# Patient Record
Sex: Male | Born: 1950 | Race: White | Hispanic: No | Marital: Married | Smoking: Never smoker
Health system: Southern US, Academic
[De-identification: ages and names within clinical notes are randomized; demographics above are authoritative.]

## PROBLEM LIST (undated history)

## (undated) DIAGNOSIS — K219 Gastro-esophageal reflux disease without esophagitis: Secondary | ICD-10-CM

## (undated) DIAGNOSIS — E119 Type 2 diabetes mellitus without complications: Secondary | ICD-10-CM

## (undated) DIAGNOSIS — I10 Essential (primary) hypertension: Secondary | ICD-10-CM

## (undated) DIAGNOSIS — Z77098 Contact with and (suspected) exposure to other hazardous, chiefly nonmedicinal, chemicals: Secondary | ICD-10-CM

## (undated) DIAGNOSIS — E7800 Pure hypercholesterolemia, unspecified: Secondary | ICD-10-CM

## (undated) DIAGNOSIS — Z7739 Contact with and (suspected) exposure to other war theater: Secondary | ICD-10-CM

## (undated) DIAGNOSIS — G4730 Sleep apnea, unspecified: Secondary | ICD-10-CM

## (undated) HISTORY — DX: Gastro-esophageal reflux disease without esophagitis: K21.9

## (undated) HISTORY — DX: Essential (primary) hypertension: I10

## (undated) HISTORY — DX: Sleep apnea, unspecified: G47.30

## (undated) HISTORY — PX: HX GALL BLADDER SURGERY/CHOLE: SHX55

---

## 2017-12-26 ENCOUNTER — Other Ambulatory Visit (HOSPITAL_COMMUNITY): Payer: Self-pay | Admitting: Cardiovascular Disease

## 2021-11-07 ENCOUNTER — Encounter (INDEPENDENT_AMBULATORY_CARE_PROVIDER_SITE_OTHER): Payer: Medicare Other | Admitting: OTOLARYNGOLOGY

## 2021-11-07 ENCOUNTER — Other Ambulatory Visit: Payer: Self-pay

## 2021-12-28 ENCOUNTER — Encounter (INDEPENDENT_AMBULATORY_CARE_PROVIDER_SITE_OTHER): Payer: Self-pay | Admitting: OTOLARYNGOLOGY

## 2021-12-28 ENCOUNTER — Other Ambulatory Visit: Payer: Self-pay

## 2021-12-28 ENCOUNTER — Ambulatory Visit (INDEPENDENT_AMBULATORY_CARE_PROVIDER_SITE_OTHER): Payer: Medicare Other | Admitting: OTOLARYNGOLOGY

## 2021-12-28 VITALS — Ht 70.0 in | Wt 205.0 lb

## 2021-12-28 DIAGNOSIS — J342 Deviated nasal septum: Secondary | ICD-10-CM

## 2021-12-28 DIAGNOSIS — J309 Allergic rhinitis, unspecified: Secondary | ICD-10-CM

## 2021-12-28 DIAGNOSIS — K148 Other diseases of tongue: Secondary | ICD-10-CM

## 2021-12-28 NOTE — H&P (Signed)
[facility]  [facility], [facility]    Progress Note    Name: Evan Bell MRN:  [id number]   Date: 12/28/2021 Age: 71 y.o.          Follow Up      Subjective:   Chief Complaint:   Mouth Lesions (Rc on mouth lesion. States no changes noted.)       History of Present Illness:  Evan Bell is a 71 y.o. old male who presents to the clinic for follow-up. Patient states that he has not had any facial pain, black saliva and tongue lesion is unchanged. He is using Flonase that controls his nasal congestion.    Review of Systems   Constitutional: Negative.         Physical Exam:     Vitals:    12/28/21 1136   Weight: 93 kg (205 lb)   Height: 1.778 m (5\' 10" )   BMI: 29.48      ENT Physical Exam  Constitutional  Appearance: patient appears well-developed, well-nourished and well-groomed,  Communication/Voice: communication appropriate for developmental age; vocal quality normal;  Head and Face  Appearance: head appears normal, face appears normal and face appears atraumatic;  Palpation: facial palpation normal;  Salivary: glands normal;  Ear  Hearing: intact;  Auricles: right auricle normal; left auricle normal;  External Mastoids: right external mastoid normal; left external mastoid normal;  Ear Canals: right ear canal normal; left ear canal normal;  Tympanic Membranes: right tympanic membrane normal; left tympanic membrane normal;  Nose  External Nose: nares patent bilaterally; external nose normal;  Internal Nose: nasal mucosa normal; nasal septal deviation present; bilateral inferior turbinates erythematous;  Oral Cavity/Oropharynx  Lips: normal;  Teeth: normal;  Gums: gingiva normal;  Tongue: tongue lesion (small granuloma left anterior tongue) present;  Oral mucosa: normal;  Hard palate: normal;  Soft palate: normal;  Tonsils: normal;  Base of Tongue: normal;  Posterior pharyngeal wall: normal;  Neck  Neck: neck normal; neck palpation normal;  Thyroid: thyroid normal;  Respiratory  Inspection: breathing unlabored;  normal breathing rate;  Lymphatic  Palpation: lymph nodes normal;  Neurovestibular  Mental Status: alert and oriented;  Psychiatric: mood normal; affect is appropriate;  Cranial Nerves: cranial nerves intact;       Assessment and Plan:       ICD-10-CM    1. DNS (deviated nasal septum)  J34.2       2. Allergic rhinitis, unspecified seasonality, unspecified trigger  J30.9       3. Tongue lesion  K14.8         Continue Flonase  Patient wishes to continue to monitor tongue lesion      Follow up:  Return in about 3 months (around 03/30/2022).    05/30/2022, DO

## 2022-01-03 ENCOUNTER — Telehealth (INDEPENDENT_AMBULATORY_CARE_PROVIDER_SITE_OTHER): Payer: Self-pay | Admitting: INTERVENTIONAL CARDIOLOGY

## 2022-01-09 IMAGING — US US ABDOMEN COMPLETE
1 series · 14 of 25 positions shown · non-contrast
Comparison: None.

﻿EXAM: US ABDOMEN COMPLETE
INDICATION: Right upper quadrant pain.

[Series 1: us abdomen complete · 14 of 47 slices shown]
[im 1/47]
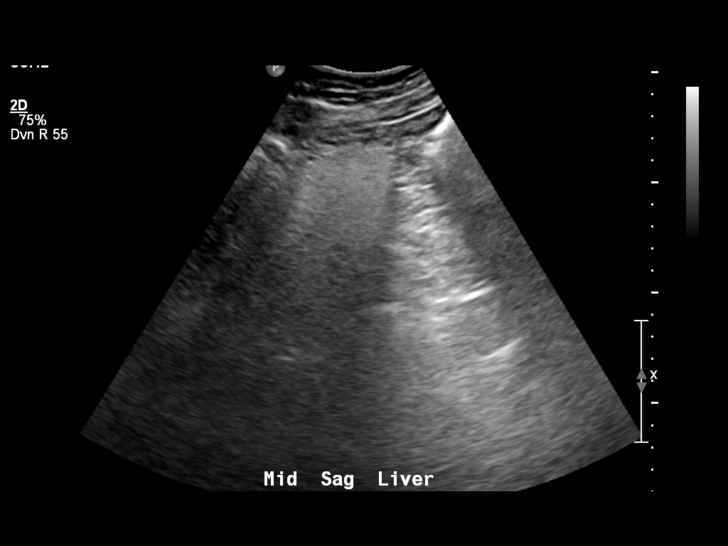
[im 4/47]
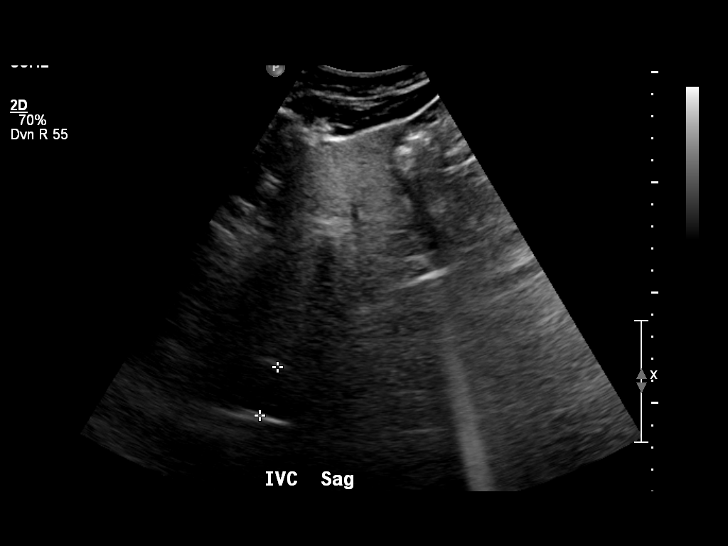
[im 8/47]
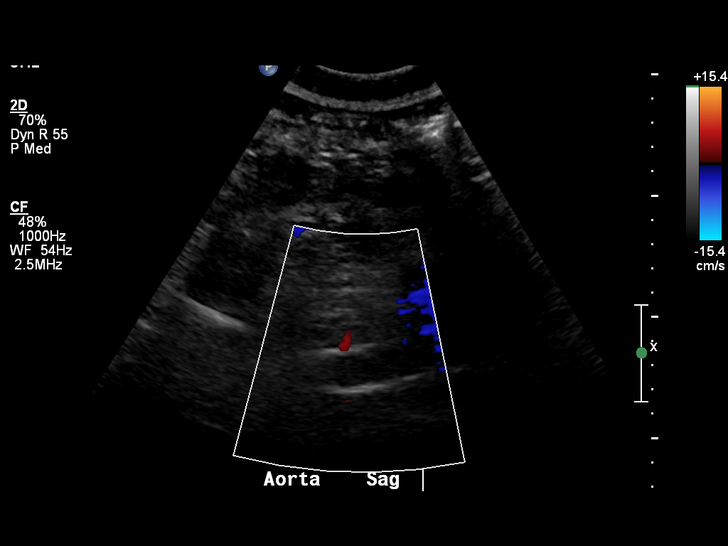
[im 12/47]
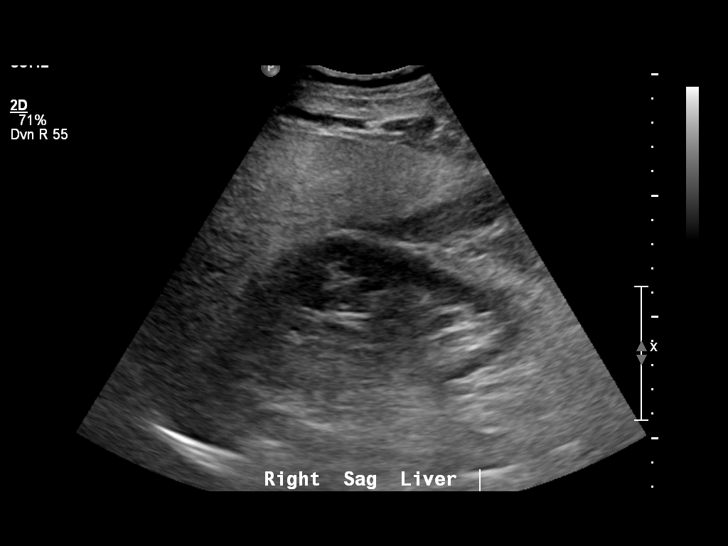
[im 16/47]
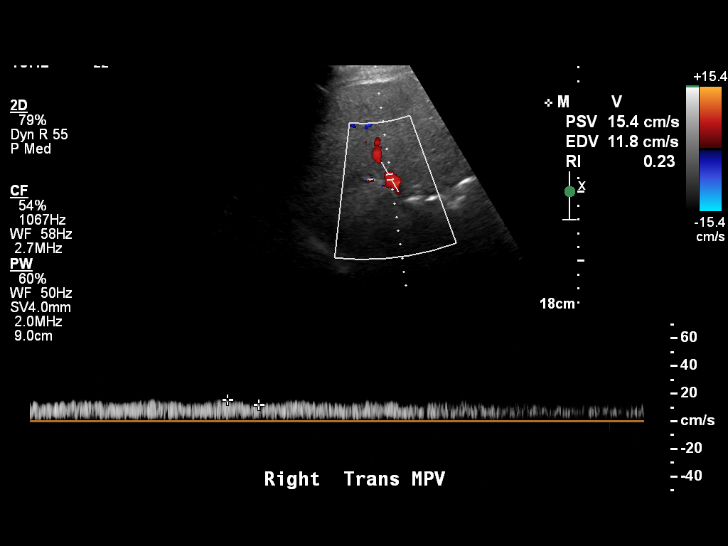
[im 18/47]
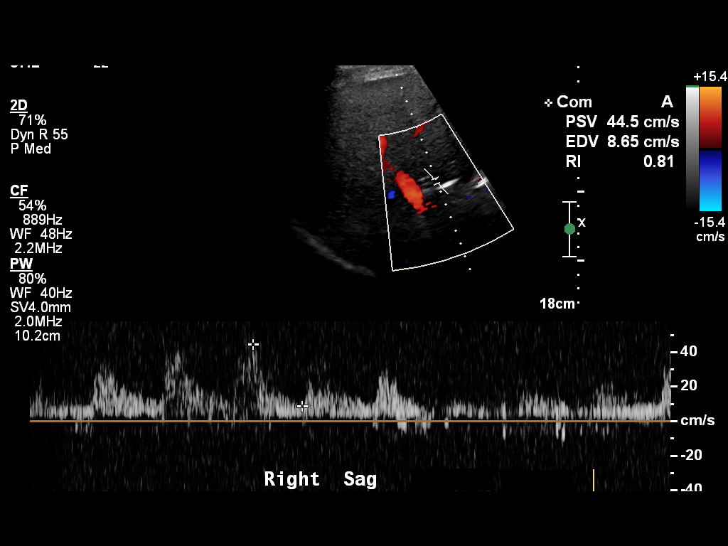
[im 22/47]
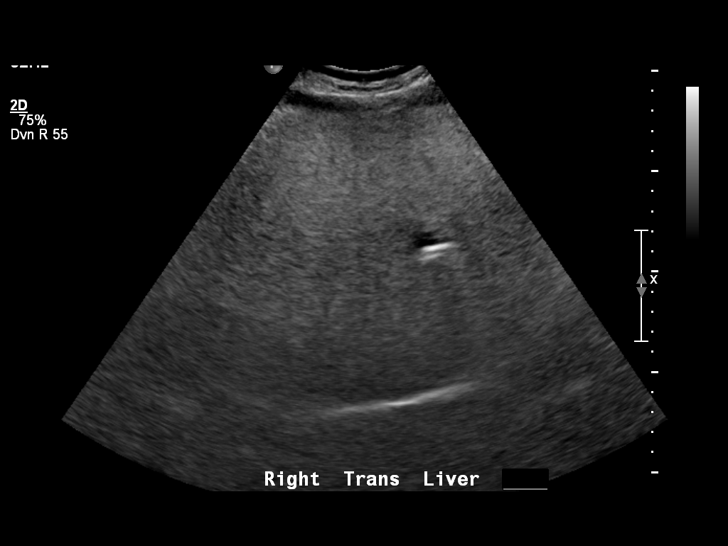
[im 25/47]
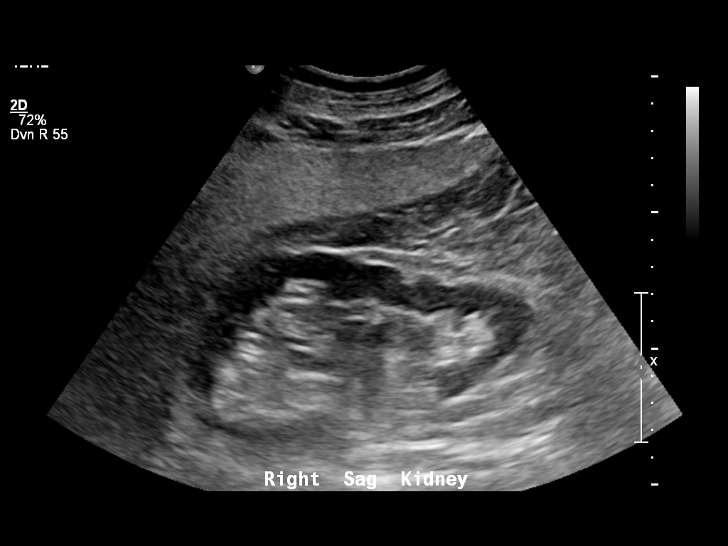
[im 29/47]
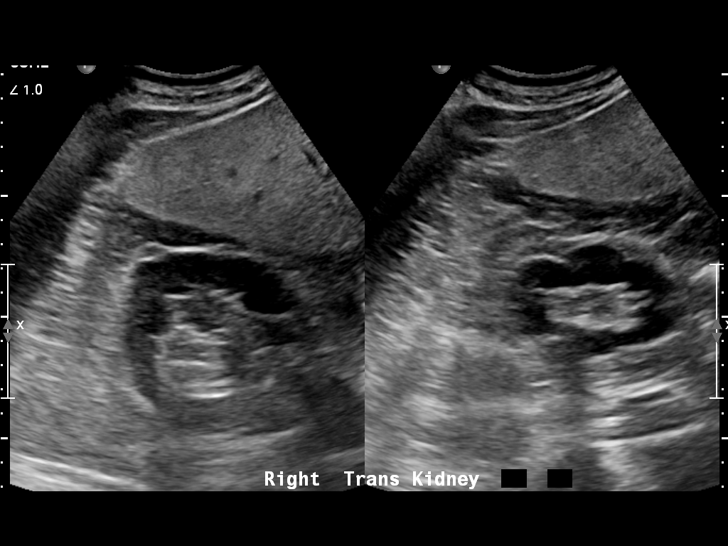
[im 31/47]
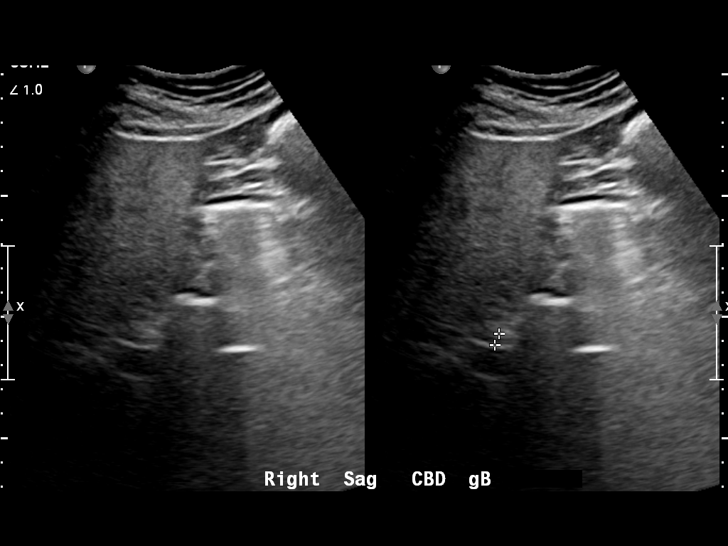
[im 35/47]
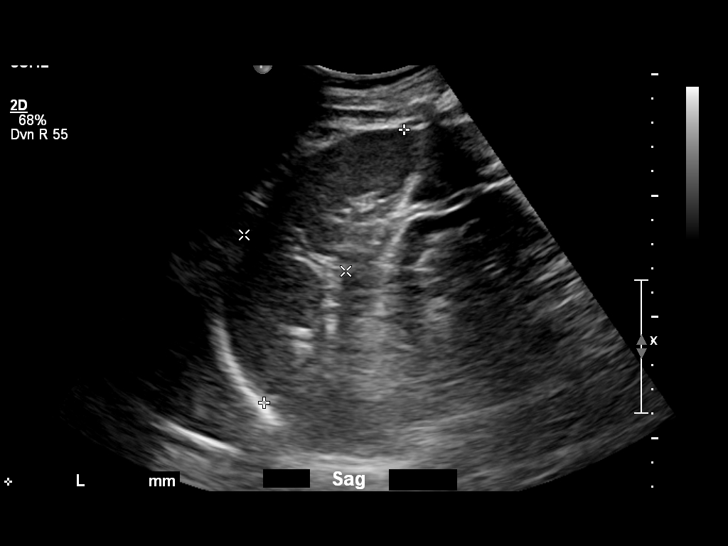
[im 39/47]
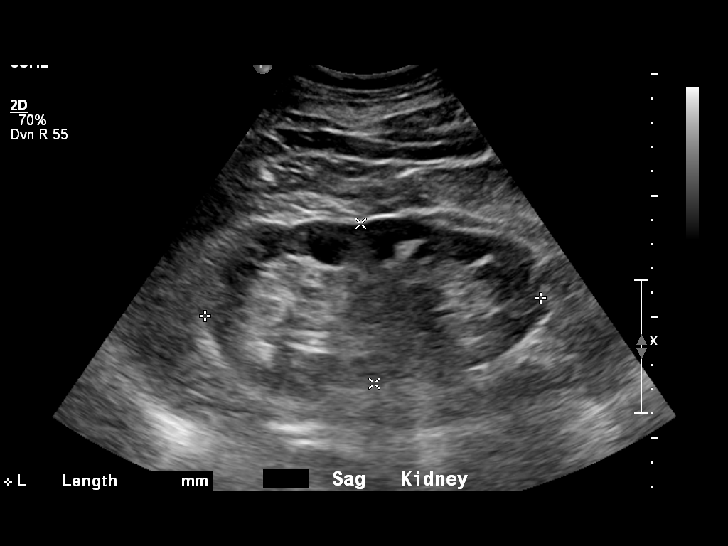
[im 43/47]
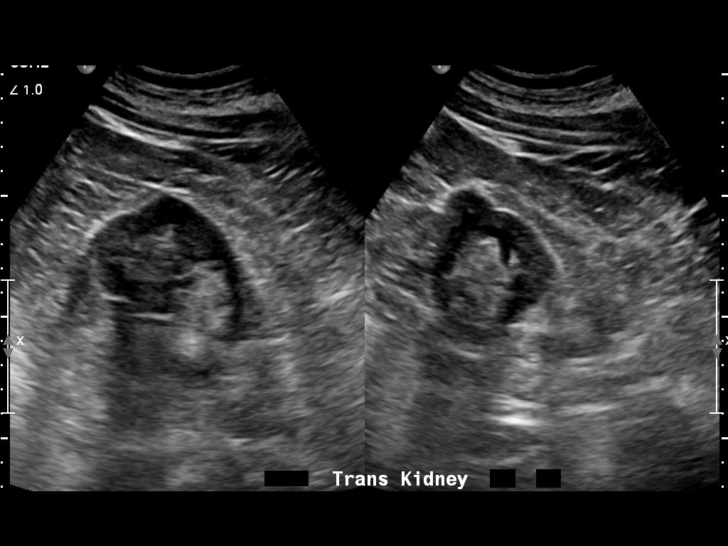
[im 47/47]
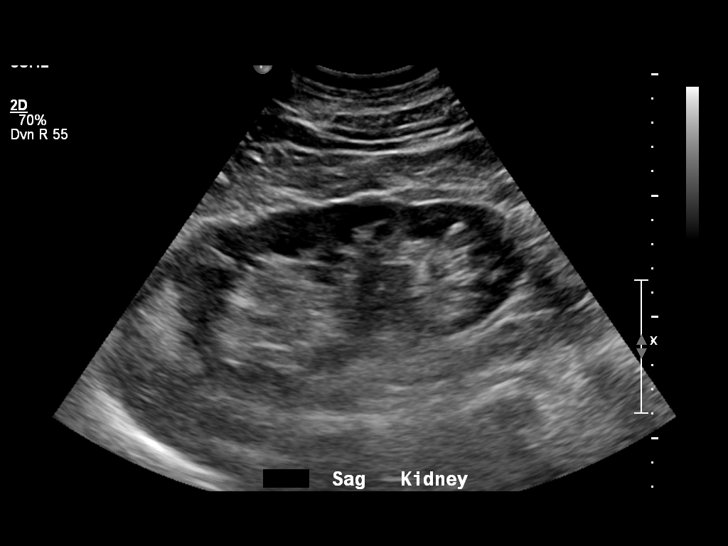

[14 of 25 positions shown; findings below may reference images not displayed]

FINDINGS: Liver is echogenic compatible with fatty infiltration.  Fatty infiltration limits evaluation for focal hepatic mass. There is no intra or extrahepatic biliary ductal dilation. Common bile duct measures 5 mm.  Gallbladder is surgically absent. Pancreas is incompletely visualized due to artifact from overlying bowel gas. Spleen measures 12.5 cm and is unremarkable.

Kidneys are normal in echogenicity and measure 13.5 cm bilaterally. There is no hydronephrosis, mass or cyst on either side.

Abdominal aorta is also not well visualized. IVC is normal. There is no ascites.
IMPRESSION: 1. Fatty liver.

2. Prior cholecystectomy.

3. Pancreas and abdominal aorta incompletely visualized due to artifact from overlying bowel gas.

## 2022-01-29 ENCOUNTER — Other Ambulatory Visit: Payer: Self-pay

## 2022-01-29 ENCOUNTER — Encounter (HOSPITAL_COMMUNITY)
Admission: RE | Disposition: A | Discharge status: Home / Self Care | Payer: Self-pay | Source: Ambulatory Visit | Attending: INTERVENTIONAL CARDIOLOGY

## 2022-01-29 ENCOUNTER — Encounter (HOSPITAL_COMMUNITY): Payer: Self-pay | Admitting: INTERVENTIONAL CARDIOLOGY

## 2022-01-29 ENCOUNTER — Inpatient Hospital Stay
Admission: RE | Admit: 2022-01-29 | Discharge: 2022-01-29 | Disposition: A | Discharge status: Home / Self Care | Payer: Medicare Other | Source: Ambulatory Visit | Attending: INTERVENTIONAL CARDIOLOGY | Admitting: INTERVENTIONAL CARDIOLOGY

## 2022-01-29 DIAGNOSIS — I10 Essential (primary) hypertension: Secondary | ICD-10-CM | POA: Diagnosis present

## 2022-01-29 DIAGNOSIS — I208 Other forms of angina pectoris: Secondary | ICD-10-CM | POA: Diagnosis present

## 2022-01-29 DIAGNOSIS — E782 Mixed hyperlipidemia: Secondary | ICD-10-CM | POA: Diagnosis present

## 2022-01-29 DIAGNOSIS — I2510 Atherosclerotic heart disease of native coronary artery without angina pectoris: Secondary | ICD-10-CM | POA: Insufficient documentation

## 2022-01-29 DIAGNOSIS — R079 Chest pain, unspecified: Secondary | ICD-10-CM

## 2022-01-29 LAB — CBC WITH DIFF
BASOPHIL #: 0.1 10*3/uL (ref 0.00–0.30)
BASOPHIL %: 1 % (ref 0–3)
EOSINOPHIL #: 0.4 10*3/uL (ref 0.00–0.80)
EOSINOPHIL %: 5 % (ref 0–7)
HCT: 41.3 % — ABNORMAL LOW (ref 42.0–51.0)
HGB: 14.2 g/dL (ref 13.5–18.0)
LYMPHOCYTE #: 2.6 10*3/uL (ref 1.10–5.00)
LYMPHOCYTE %: 32 % (ref 25–45)
MCH: 29.2 pg (ref 27.0–32.0)
MCHC: 34.4 g/dL (ref 32.0–36.0)
MCV: 84.7 fL (ref 78.0–99.0)
MONOCYTE #: 0.9 10*3/uL (ref 0.00–1.30)
MONOCYTE %: 11 % (ref 0–12)
MPV: 7.3 fL — ABNORMAL LOW (ref 7.4–10.4)
NEUTROPHIL #: 4.2 10*3/uL (ref 1.80–8.40)
NEUTROPHIL %: 51 % (ref 40–76)
PLATELETS: 256 10*3/uL (ref 140–440)
RBC: 4.88 10*6/uL (ref 4.20–6.00)
RDW: 13.5 % (ref 11.6–14.8)
WBC: 8.2 10*3/uL (ref 4.0–10.5)
WBCS UNCORRECTED: 8.2 10*3/uL

## 2022-01-29 LAB — BASIC METABOLIC PANEL
ANION GAP: 11 mmol/L (ref 10–20)
BUN/CREA RATIO: 13 (ref 6–22)
BUN: 19 mg/dL (ref 7–25)
CALCIUM: 10.2 mg/dL (ref 8.6–10.3)
CHLORIDE: 90 mmol/L — ABNORMAL LOW (ref 98–107)
CO2 TOTAL: 28 mmol/L (ref 21–31)
CREATININE: 1.46 mg/dL — ABNORMAL HIGH (ref 0.60–1.30)
ESTIMATED GFR: 51 mL/min/{1.73_m2} — ABNORMAL LOW (ref >59)
GLUCOSE: 167 mg/dL — ABNORMAL HIGH (ref 74–109)
OSMOLALITY, CALCULATED: 265 mOsm/kg — ABNORMAL LOW (ref 270–290)
POTASSIUM: 4.2 mmol/L (ref 3.5–5.1)
SODIUM: 129 mmol/L — ABNORMAL LOW (ref 136–145)

## 2022-01-29 LAB — PTT (PARTIAL THROMBOPLASTIN TIME): APTT: 31.4 seconds (ref 26.0–36.0)

## 2022-01-29 LAB — PT/INR
INR: 1.06 (ref <=5.00)
PROTHROMBIN TIME: 12.3 seconds (ref 9.8–12.7)

## 2022-01-29 LAB — MAGNESIUM: MAGNESIUM: 1.6 mg/dL — ABNORMAL LOW (ref 1.9–2.7)

## 2022-01-29 SURGERY — CORONARY ANGIOGRAPHY
Anesthesia: IV Sedation (Nurse Monitored)

## 2022-01-29 MED ORDER — DIAZEPAM 5 MG TABLET
5.0000 mg | ORAL_TABLET | ORAL | Status: AC
Start: 2022-01-29 — End: 2022-01-29
  Administered 2022-01-29: 5 mg via ORAL

## 2022-01-29 MED ORDER — HEPARIN (PORCINE) (PF) 1,000 UNIT/500 ML IN 0.9 % SODIUM CHLORIDE IV
INTRAVENOUS | Status: AC
Start: 2022-01-29 — End: 2022-01-29
  Filled 2022-01-29: qty 1500

## 2022-01-29 MED ORDER — BIVALIRUDIN 250 MG INTRAVENOUS POWDER FOR SOLUTION
INTRAVENOUS | Status: AC
Start: 2022-01-29 — End: 2022-01-29
  Filled 2022-01-29: qty 5

## 2022-01-29 MED ORDER — SODIUM CHLORIDE 0.9 % INTRAVENOUS SOLUTION
INTRAVENOUS | Status: DC
Start: 2022-01-29 — End: 2022-01-29
  Administered 2022-01-29: 0 mL via INTRAVENOUS

## 2022-01-29 MED ORDER — FENTANYL (PF) 50 MCG/ML INJECTION WRAPPER
INJECTION | Freq: Once | INTRAMUSCULAR | Status: DC | PRN
Start: 2022-01-29 — End: 2022-01-29
  Administered 2022-01-29: 50 ug via INTRAVENOUS

## 2022-01-29 MED ORDER — VERAPAMIL 2.5 MG/ML INTRAVENOUS SOLUTION
INTRAVENOUS | Status: AC
Start: 2022-01-29 — End: 2022-01-29
  Filled 2022-01-29: qty 2

## 2022-01-29 MED ORDER — VERAPAMIL 2.5 MG/ML INTRAVENOUS SOLUTION
Freq: Once | INTRAVENOUS | Status: DC | PRN
Start: 2022-01-29 — End: 2022-01-29
  Administered 2022-01-29: 5 mg via INTRA_ARTERIAL

## 2022-01-29 MED ORDER — MAGNESIUM SULFATE 1 GRAM/100 ML IN DEXTROSE 5 % INTRAVENOUS PIGGYBACK
1.0000 g | INJECTION | Freq: Once | INTRAVENOUS | Status: AC
Start: 2022-01-29 — End: 2022-01-29
  Administered 2022-01-29: 1 g via INTRAVENOUS
  Administered 2022-01-29: 0 g via INTRAVENOUS

## 2022-01-29 MED ORDER — NITROGLYCERIN 100 MCG/ML IN NS INJECTION
INJECTION | Freq: Once | INTRAVENOUS | Status: DC | PRN
Start: 2022-01-29 — End: 2022-01-29
  Administered 2022-01-29: 200 ug via INTRA_ARTERIAL

## 2022-01-29 MED ORDER — MIDAZOLAM 5 MG/ML INJECTION WRAPPER
Freq: Once | INTRAMUSCULAR | Status: DC | PRN
Start: 2022-01-29 — End: 2022-01-29
  Administered 2022-01-29: 2 mg via INTRAVENOUS

## 2022-01-29 MED ORDER — RANOLAZINE ER 500 MG TABLET,EXTENDED RELEASE,12 HR
500.0000 mg | ORAL_TABLET | Freq: Two times a day (BID) | ORAL | 4 refills | Status: DC
Start: 2022-01-29 — End: 2023-11-14

## 2022-01-29 MED ORDER — HEPARIN (PORCINE) 1,000 UNIT/ML INJECTION SOLUTION
Freq: Once | INTRAMUSCULAR | Status: DC | PRN
Start: 2022-01-29 — End: 2022-01-29
  Administered 2022-01-29: 4750 [IU] via INTRAVENOUS

## 2022-01-29 MED ORDER — SODIUM CHLORIDE 0.9 % INTRAVENOUS SOLUTION
INTRAVENOUS | Status: AC | PRN
Start: 2022-01-29 — End: 2022-01-29
  Administered 2022-01-29: 150 mL/h via INTRAVENOUS

## 2022-01-29 MED ORDER — MIDAZOLAM 5 MG/ML INJECTION WRAPPER
INTRAMUSCULAR | Status: AC
Start: 2022-01-29 — End: 2022-01-29
  Filled 2022-01-29: qty 1

## 2022-01-29 MED ORDER — METFORMIN 1,000 MG TABLET
1000.0000 mg | ORAL_TABLET | Freq: Two times a day (BID) | ORAL | Status: DC
Start: 2022-01-29 — End: 2023-11-14

## 2022-01-29 MED ORDER — ASPIRIN 81 MG CHEWABLE TABLET
CHEWABLE_TABLET | ORAL | Status: AC
Start: 2022-01-29 — End: 2022-01-29
  Filled 2022-01-29: qty 4

## 2022-01-29 MED ORDER — ACETAMINOPHEN 325 MG TABLET
975.0000 mg | ORAL_TABLET | Freq: Once | ORAL | Status: DC | PRN
Start: 2022-01-29 — End: 2022-01-29

## 2022-01-29 MED ORDER — ASPIRIN 81 MG CHEWABLE TABLET
324.0000 mg | CHEWABLE_TABLET | Freq: Once | ORAL | Status: AC
Start: 2022-01-29 — End: 2022-01-29
  Administered 2022-01-29: 324 mg via ORAL

## 2022-01-29 MED ORDER — DIPHENHYDRAMINE 25 MG CAPSULE
25.0000 mg | ORAL_CAPSULE | ORAL | Status: AC
Start: 2022-01-29 — End: 2022-01-29
  Administered 2022-01-29: 25 mg via ORAL

## 2022-01-29 MED ORDER — MAGNESIUM SULFATE 1 GRAM/100 ML IN DEXTROSE 5 % INTRAVENOUS PIGGYBACK
INJECTION | INTRAVENOUS | Status: AC
Start: 2022-01-29 — End: 2022-01-29
  Filled 2022-01-29: qty 200

## 2022-01-29 MED ORDER — FENTANYL (PF) 50 MCG/ML INJECTION SOLUTION
INTRAMUSCULAR | Status: AC
Start: 2022-01-29 — End: 2022-01-29
  Filled 2022-01-29: qty 2

## 2022-01-29 MED ORDER — LIDOCAINE HCL 10 MG/ML (1 %) INJECTION SOLUTION
Freq: Once | INTRAMUSCULAR | Status: DC | PRN
Start: 2022-01-29 — End: 2022-01-29
  Administered 2022-01-29: 5 mL via INTRADERMAL

## 2022-01-29 MED ORDER — DIAZEPAM 5 MG TABLET
ORAL_TABLET | ORAL | Status: AC
Start: 2022-01-29 — End: 2022-01-29
  Filled 2022-01-29: qty 1

## 2022-01-29 MED ORDER — LIDOCAINE HCL 10 MG/ML (1 %) INJECTION SOLUTION
INTRAMUSCULAR | Status: AC
Start: 2022-01-29 — End: 2022-01-29
  Filled 2022-01-29: qty 50

## 2022-01-29 MED ORDER — MAGNESIUM SULFATE 1 GRAM/100 ML IN DEXTROSE 5 % INTRAVENOUS PIGGYBACK
1.0000 g | INJECTION | Freq: Once | INTRAVENOUS | Status: AC
Start: 2022-01-29 — End: 2022-01-29
  Administered 2022-01-29: 0 g via INTRAVENOUS
  Administered 2022-01-29: 1 g via INTRAVENOUS

## 2022-01-29 MED ORDER — IOHEXOL 350 MG IODINE/ML INTRAVENOUS SOLUTION
Freq: Once | INTRAVENOUS | Status: DC | PRN
Start: 2022-01-29 — End: 2022-01-29
  Administered 2022-01-29: 60 mL via INTRA_ARTERIAL

## 2022-01-29 MED ORDER — SODIUM CHLORIDE 0.9 % (FLUSH) INJECTION SYRINGE
3.0000 mL | INJECTION | Freq: Three times a day (TID) | INTRAMUSCULAR | Status: DC
Start: 2022-01-29 — End: 2022-01-29

## 2022-01-29 MED ORDER — SODIUM CHLORIDE 0.9 % (FLUSH) INJECTION SYRINGE
3.0000 mL | INJECTION | INTRAMUSCULAR | Status: DC | PRN
Start: 2022-01-29 — End: 2022-01-29

## 2022-01-29 MED ORDER — DIPHENHYDRAMINE 25 MG CAPSULE
ORAL_CAPSULE | ORAL | Status: AC
Start: 2022-01-29 — End: 2022-01-29
  Filled 2022-01-29: qty 1

## 2022-01-29 SURGICAL SUPPLY — 16 items
CATH ANGIO 5FR JR5 CURVE 100CM PERFORMA RADOPQ BRD PERI (CATHETERS) ×1
CATH ANGIO 5FR JR5 CURVE 100CM PERFORMA RADOPQ BRD PERI PEBAX PLYCRB NYL STRL ACPT .038IN GW (CATHETERS) ×1 IMPLANT
CATH ANGIO 5FR PGTL ST CURVE 110CM PERFORMA MIV 6 SH RADOPQ (CATHETERS) ×1
CATH ANGIO 5FR PGTL ST CURVE 110CM PERFORMA MIV 6 SH RADOPQ BRD RADIAL PEBAX PLYCRB NYL STRL LF (CATHETERS) ×1 IMPLANT
CATH ANGIO 5FR RADIAL TIG 4 CURVE 100CM OPTITORQUE LRG LUM SH 2 BRD SFT TIP COR SS NYL POLYUR (CATHETERS) ×1 IMPLANT
CATH ANGIO 5FR RADL TIG 4 CURV_E 100CM OPTITORQUE LRG LUM SH (CATHETERS) ×1
DEVICE COMPRESS TR BAND 24CM 2 BAL TRNSPR ADJ STRAP REG RADIAL ART (MED SURG SUPPLIES) ×1 IMPLANT
DEVICE COMPRESS TR BAND 24CM 2_BAL TRNSPR ADJ STRAP AIR TRTN (MED SURG SUPPLIES) ×1
GW .035IN 260CM ANGIO PTFE FIX COR STD BODY STN FNSH 2 END 3MM J CURVE STRL (WIRE) ×1 IMPLANT
GW GLDWR .035IN 3CM 150CM FLXB ANG TIP RADOPQ KINK RST NITINOL TUNG POLYUR HDRPH VAS STD (WIRE) ×1 IMPLANT
GW GLDWR .035IN 3CM 150CM RADO_Q STD SHFT FLXB COR TO TIP (WIRE) ×1
GW INQWR .035IN 260CM FIX COR_FINGER STRG STD SHAFT PTFE VAS (WIRE) ×1
GW STORQ .035IN 300CM STD TIP STRBL VAS STR LF (WIRE) ×2 IMPLANT
KIT INTROD 10CM 6FR 22GA GLIDESHEATH SLNDR .021IN PLASTIC SHEATH DIL 2 WL PNCT SHORT ANG MINIWIRE 45 (INTRODUCER) ×1 IMPLANT
KIT INTROD 10CM 6FR 22GA GLIDE_SHEATH SLNDR .021IN PLASTIC (INTRODUCER) ×1
SET ADMN MERIT MEDICAL SYS INJ MED CNRST S DISP (VASCULAR) ×2 IMPLANT

## 2022-01-29 NOTE — Nurses Notes (Signed)
Patient voided 600 cc's clear, yellow urine without any complaints or problems.

## 2022-01-29 NOTE — Nurses Notes (Signed)
Patient drinking without any problems. Lunch ordered. Called and updated patient's family. Spoke with his cousin Angie Maggard who is staying with his wife. Informed should be discharged around 1300 if no problems. She voiced understanding. Informed her we go over discharge instructions with her when she arrives.

## 2022-01-29 NOTE — Nurses Notes (Signed)
TR Band was removed. Site soft with no bleeding, swelling or signs of hematoma. Able to move fingers freely. Right radial pulse normal. Sterile 2x2 applied and secured with opsite. Reminded patient no lifting more than 2 lbs. X 2 days. Patient voiced understanding.

## 2022-01-29 NOTE — H&P (Signed)
[facility]  H&P Update Form    Evan Bell, Evan Bell, 71 y.o. male  Encounter Start Date:  01/29/2022  Inpatient Admission Date:   Date of Birth:  1950-12-27    01/29/2022    STOP: IF H&P IS GREATER THAN 30 DAYS FROM SURGICAL DAY COMPLETE NEW H&P IS REQUIRED.     H & P updated the day of the procedure.  1.  H&P completed within 30 days of surgical procedure and has been reviewed within 24 hours of admission but prior to surgery or a procedure requiring anesthesia services by Dr. Lana Fish within 30 days of procedure , the patient has been examined, and no change has occured in the patients condition since the H&P was completed.       Change in medications: No                   2.  Patient continues to be appropiate candidate for planned surgical procedure. YES      [name], MD

## 2022-01-29 NOTE — Nurses Notes (Signed)
Patient sitting up in bed. Ate 100% for lunch. Dr. Elesa Massed in discussing results of LHC with patient.

## 2022-01-29 NOTE — Nurses Notes (Signed)
Patient ambulated to restroom. Gait steady and coordinated. Denies any complaints of lightheadedness or dizziness.

## 2022-01-29 NOTE — Nurses Notes (Signed)
Dr. Ward in to see patient

## 2022-01-29 NOTE — Nurses Notes (Signed)
Patient discharged to home with his cousin Evan Bell who is driving him home. Discharge instructions reviewed with patient and cousin. Instructed new prescription called into [facility] in [place], [place] for [place]a. Reviewed the Stop medications on his discharge papers. Pt. States he has discontinued those and will check on the ones that are listed on how to take. Instructed to hold Metformin x 48 hours then resume. Reminded not to lift anything that weighs more than 2 lbs. X 2 days. If starts to bleed, swell or develop a hematoma apply manual pressure and call 911. Patient and cousin deny any further questions at discharge. Patient taken out via wheelchair to private vehicle. Medications and cellphone in hand. Denies any questions.

## 2022-01-29 NOTE — Discharge Instructions (Addendum)
Follow up appointment with Dr. Lana Fish on February 12, 2022 @ 1:30 pm    Patient did not bring several medications that was on his list. States he only takes what he brought with him. Encouraged the patient to check his home medication list.  STOP medications is highlighted in red. These are the ones that he did not bring with him.    Hold Metformin x 2 days then resume.

## 2022-04-03 ENCOUNTER — Encounter (INDEPENDENT_AMBULATORY_CARE_PROVIDER_SITE_OTHER): Payer: Self-pay | Admitting: OTOLARYNGOLOGY

## 2022-04-06 IMAGING — MR MRI BRAIN W/O CONTRAST
8 of 9 series · 36 of 48 positions shown · non-contrast
Comparison: No prior imaging studies of the brain are available for comparison.

﻿EXAM:  MRI BRAIN W/O CONTRAST
INDICATION: 71-year-old having bilateral lower extremity weakness and loss of balance.  No history of malignancy or cranial surgery.
TECHNIQUE: Axial, coronal and sagittal images including diffusion sequence, T2* gradient echo sequence, FLAIR sequence and T1 and T2 sequences.

[Series 5: DWI · axial · 5.0mm · 1.35mm/px · z∈[-67,+53]mm · 10 of 88 slices shown (1 of 3)]
[im 6/88]
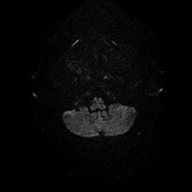
[im 12/88]
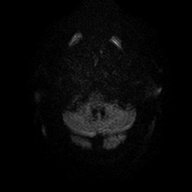
[im 18/88]
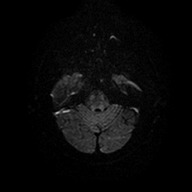
[im 30/88]
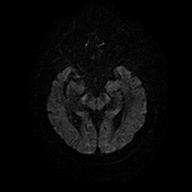
[im 41/88]
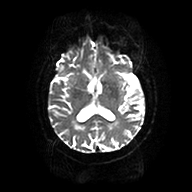
[im 47/88]
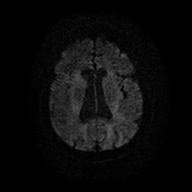
[im 53/88]
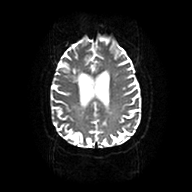
[im 64/88]
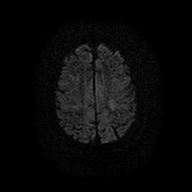
[im 76/88]
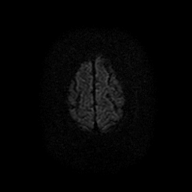
[im 88/88]
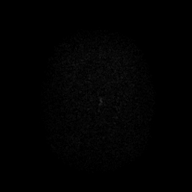

[Series 6: DWI · axial · 5.0mm · 1.35mm/px · z∈[-73,+53]mm · 4 of 22 slices shown (2 of 3)]
[im 1/22]
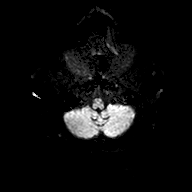
[im 8/22]
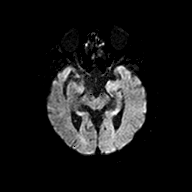
[im 15/22]
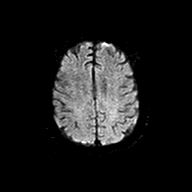
[im 22/22]
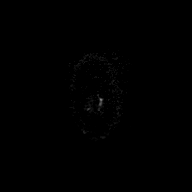

[Series 7: DWI · axial · 5.0mm · 1.35mm/px · z∈[-73,+53]mm · 4 of 21 slices shown (3 of 3)]
[im 1/21]
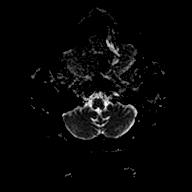
[im 7/21]
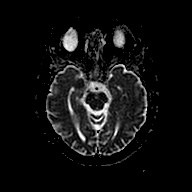
[im 14/21]
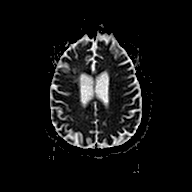
[im 21/21]
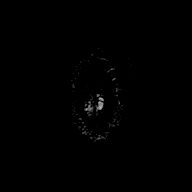

[Series 8: FLAIR · sagittal · 4.0mm · 0.75mm/px · 4 of 26 slices shown (1 of 2)]
[im 1/26]
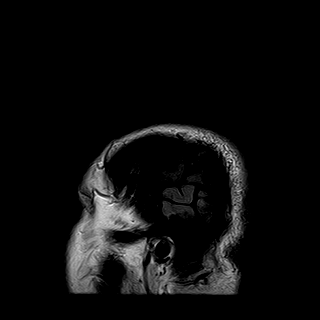
[im 9/26]
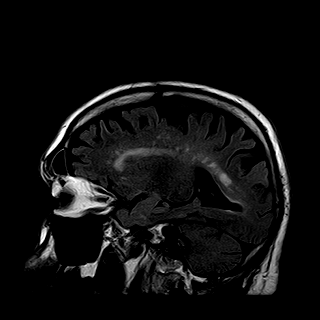
[im 17/26]
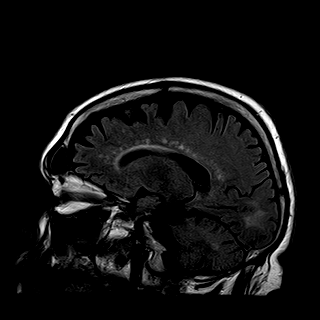
[im 26/26]
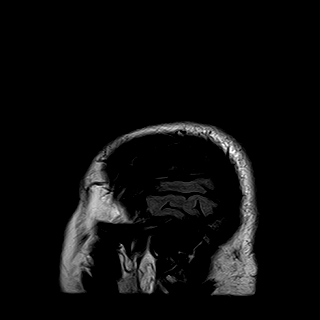

[Series 9: T2 · axial · 5.0mm · 0.43mm/px · z∈[-94,+50]mm · 4 of 25 slices shown (1 of 2)]
[im 1/25]
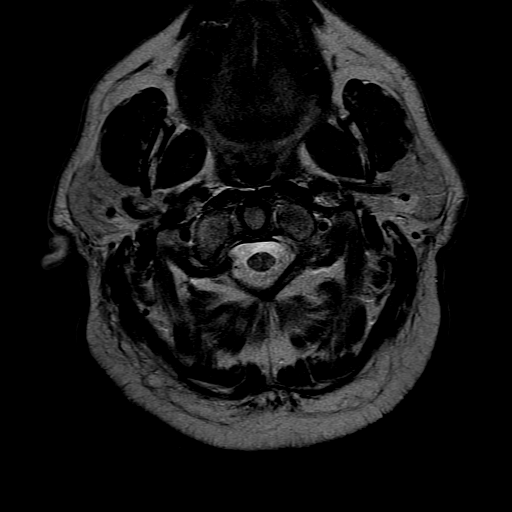
[im 9/25]
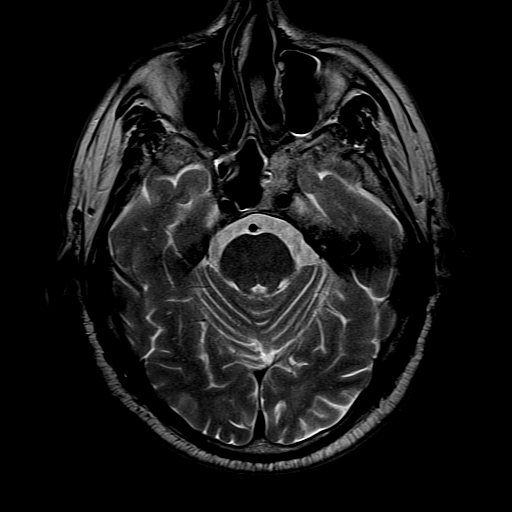
[im 17/25]
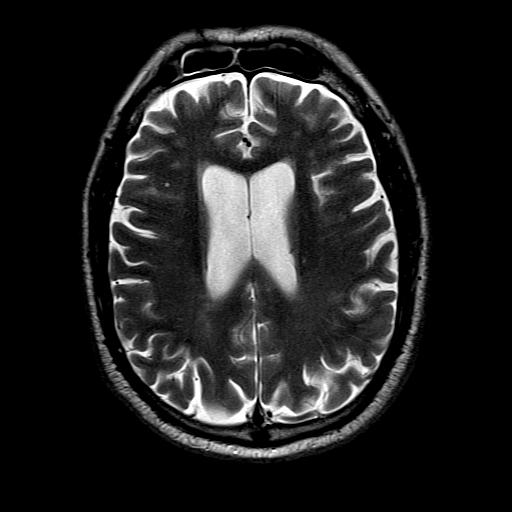
[im 25/25]
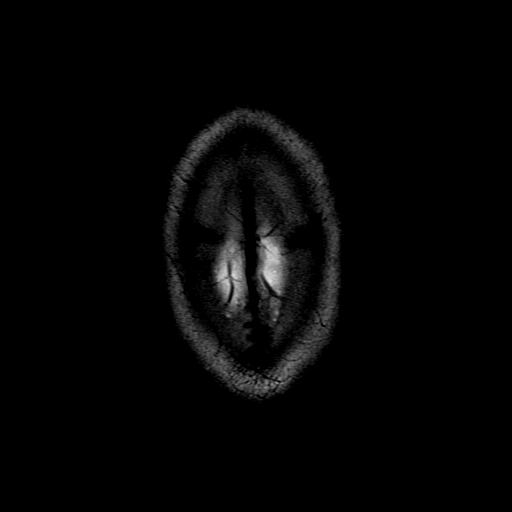

[Series 10: FLAIR · axial · 5.0mm · 0.43mm/px · z∈[-94,+50]mm · 4 of 25 slices shown (2 of 2)]
[im 1/25]
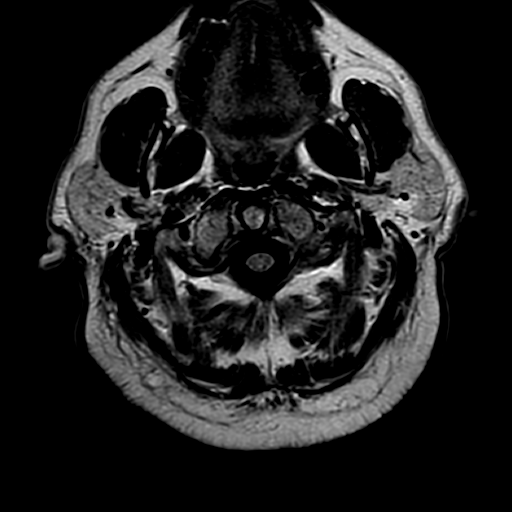
[im 9/25]
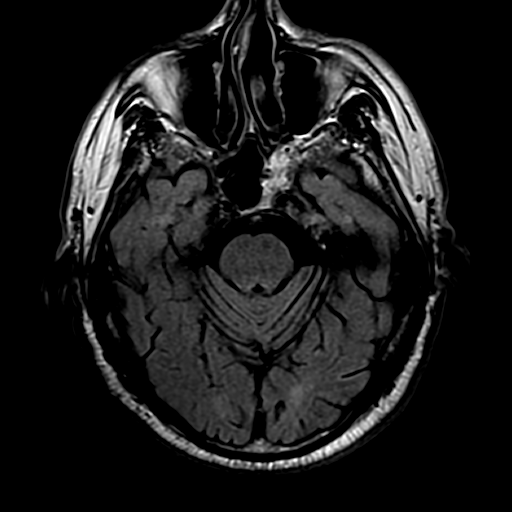
[im 17/25]
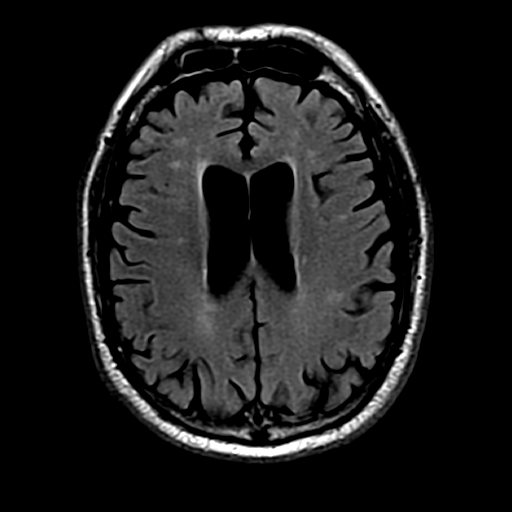
[im 25/25]
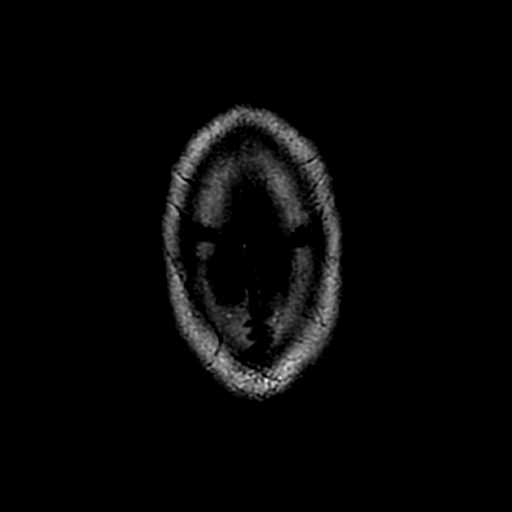

[Series 11: T1 · axial · 5.0mm · 0.43mm/px · z∈[-94,-46]mm · 2 of 25 slices shown]
[im 1/25]
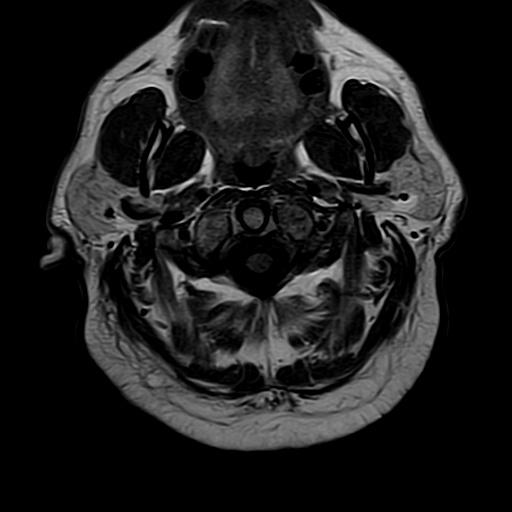
[im 9/25]
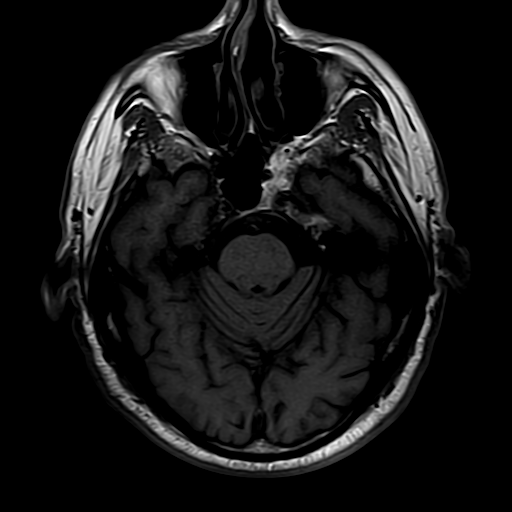

[Series 13: T2 · coronal · 6.0mm · 0.43mm/px · 4 of 24 slices shown (2 of 2)]
[im 1/24]
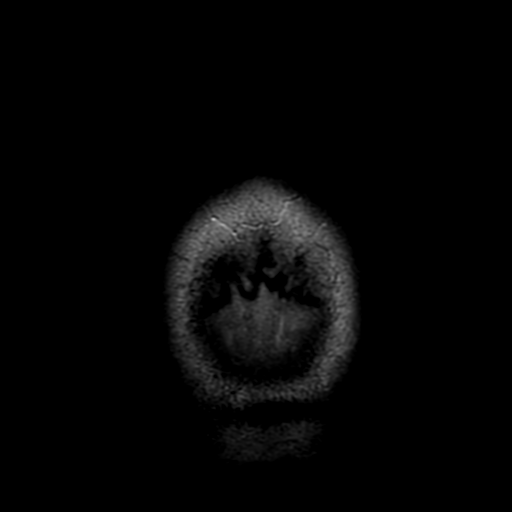
[im 8/24]
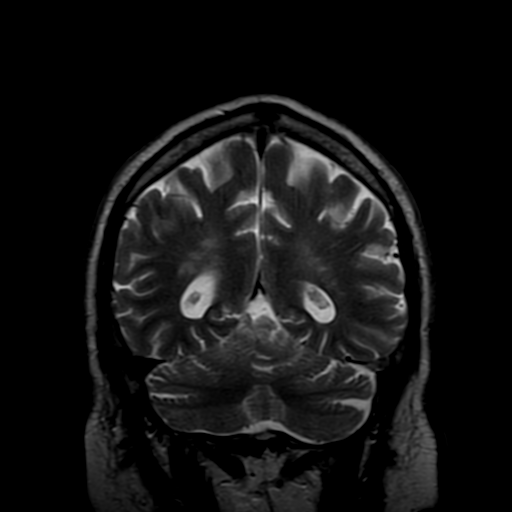
[im 16/24]
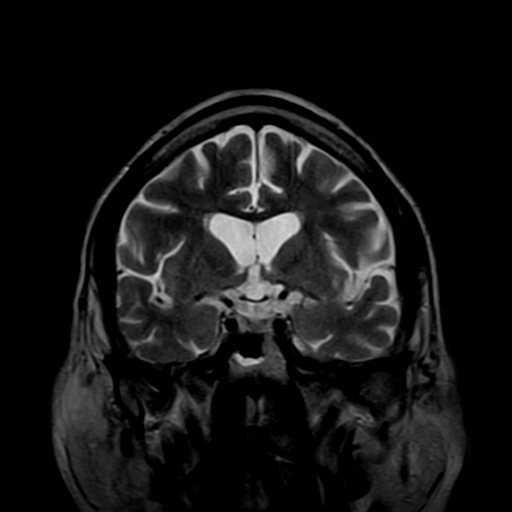
[im 24/24]
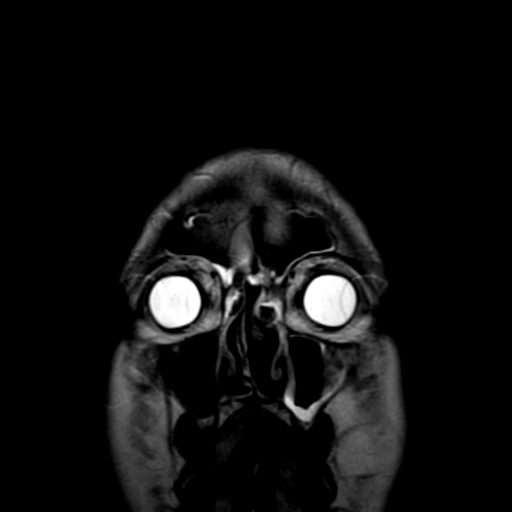

[36 of 48 positions shown; findings below may reference images not displayed]

FINDINGS: No focal areas of restricted diffusion.  No intracranial bleed or extra-axial collections are seen.  

Moderate cortical chronic small vessel ischemic change is noted in the right frontal and bilateral occipital lobes.  Major arteries of circle of Willis and dural venous sinuses are patent.  No space occupying lesions are seen.  Pituitary gland is normal.
IMPRESSION: 1. No acute ischemia or space occupying lesions or intracranial bleed. 

2. Mild chronic small vessel ischemic change on FLAIR sequence in the right subcortical frontal lobe and bilateral occipital periventricular white matter.  

3. Major arteries of circle of Willis and dural venous sinuses are patent.  

4. Mucosal inflammation of the maxillary sinus noted on the left side and both ethmoid sinuses.  Right frontal sinusitis.

## 2022-05-14 ENCOUNTER — Ambulatory Visit (INDEPENDENT_AMBULATORY_CARE_PROVIDER_SITE_OTHER): Payer: Medicare Other | Admitting: OTOLARYNGOLOGY

## 2022-05-14 ENCOUNTER — Other Ambulatory Visit: Payer: Self-pay

## 2022-05-14 ENCOUNTER — Encounter (INDEPENDENT_AMBULATORY_CARE_PROVIDER_SITE_OTHER): Payer: Self-pay | Admitting: OTOLARYNGOLOGY

## 2022-05-14 ENCOUNTER — Other Ambulatory Visit (HOSPITAL_COMMUNITY): Payer: Self-pay | Admitting: Family

## 2022-05-14 ENCOUNTER — Encounter (INDEPENDENT_AMBULATORY_CARE_PROVIDER_SITE_OTHER): Payer: Medicare Other | Admitting: OTOLARYNGOLOGY

## 2022-05-14 VITALS — Ht 70.0 in | Wt 220.0 lb

## 2022-05-14 DIAGNOSIS — J342 Deviated nasal septum: Secondary | ICD-10-CM

## 2022-05-14 DIAGNOSIS — K148 Other diseases of tongue: Secondary | ICD-10-CM

## 2022-05-14 DIAGNOSIS — K760 Fatty (change of) liver, not elsewhere classified: Secondary | ICD-10-CM

## 2022-05-14 DIAGNOSIS — H6123 Impacted cerumen, bilateral: Secondary | ICD-10-CM

## 2022-05-14 DIAGNOSIS — J309 Allergic rhinitis, unspecified: Secondary | ICD-10-CM

## 2022-05-14 MED ORDER — FLUOCINOLONE ACETONIDE OIL 0.01 % EAR DROPS
OTIC | 3 refills | Status: DC
Start: 2022-05-14 — End: 2022-05-18

## 2022-05-14 NOTE — H&P (Signed)
[facility], [place]  [place] [address]  Phone: [phone]  Fax: [phone]      Encounter Date: 05/14/2022    Patient ID: Evan Bell  MRN: [id number]    DOB: [birth year]  Age: 71 y.o. male     Progress Note       Referring Provider:  No ref. provider found    Reason for Visit:   Chief Complaint   Patient presents with    Follow Up 3 Months     Patient here for 3 month follow up on sinus. States had MRI done at [facility] that showed sinus infection        History of Present Illness:  Evan Bell is a 71 y.o. male who is FU on sinuses and tongue lesion. Pt states he does not feel the tongue lesion anymore. Denies dysphagia, neck mass, weight loss. Pt states he had a Brain MRI after having a couple falls and it showed some sinus inflammation. Using Flonase. Denies facial pressure, discolored mucus. He c/o left aural fullness x couple weeks.       Patient History:  Patient Active Problem List   Diagnosis    Atypical angina (CMS HCC)    Primary hypertension    Mixed hyperlipidemia     Current Outpatient Medications   Medication Sig    amLODIPine (NORVASC) 10 mg Oral Tablet Take 1 Tablet (10 mg total) by mouth Once a day    aspirin (ECOTRIN) 81 mg Oral Tablet, Delayed Release (E.C.) Take 1 Tablet (81 mg total) by mouth Once a day    atorvastatin (LIPITOR) 80 mg Oral Tablet Take 1 Tablet (80 mg total) by mouth Once a day    cholecalciferol, vitamin D3, 25 mcg (1,000 unit) Oral Tablet 75 mcg    cyanocobalamin (VITAMIN B-12) 500 mcg Oral Tablet 1 Tablet (500 mcg total)    DIOVAN HCT 320-12.5 mg Oral Tablet     famotidine (PEPCID) 40 mg Oral Tablet Take 1 Tablet (40 mg total) by mouth Once a day    Fluocinolone Acetonide Oil (DERMOTIC OIL) 0.01 % Otic Drops 4 gtts AU QHS PRN    fluticasone propionate (FLONASE) 50 mcg/actuation Nasal Spray, Suspension Administer 2 Sprays into each nostril Twice daily    gabapentin (NEURONTIN) 400 mg Oral Capsule 1 Capsule (400 mg total)    MetFORMIN  (GLUCOPHAGE) 1,000 mg Oral Tablet Take 1 Tablet (1,000 mg total) by mouth Twice daily Indications: hold two days post left heart catheterization    Metoprolol Succinate (TOPROL-XL) 200 mg Oral Tablet Sustained Release 24 hr Take 0.5 Tablets (100 mg total) by mouth Twice daily    Milk Thistle 175 mg Oral Tablet Take 1 Tablet (175 mg total) by mouth Once a day    montelukast (SINGULAIR) 10 mg Oral Tablet 1 Tablet (10 mg total)    pantoprazole (PROTONIX) 40 mg Oral Tablet, Delayed Release (E.C.) Take 1 Tablet (40 mg total) by mouth Twice daily    ranolazine (RANEXA) 500 mg Oral Tablet Sustained Release 12 hr Take 1 Tablet (500 mg total) by mouth Twice daily    Sildenafil (VIAGRA) 100 mg Oral Tablet 1 Tablet (100 mg total)    Vardenafil (LEVITRA) 20 mg Oral Tablet 1 Tablet (20 mg total)      Allergies   Allergen Reactions    Buspirone  Other Adverse Reaction (Add comment)    Prazosin Mental Status Effect    Sertraline      Other  reaction(s): Numbness     Past Medical History:   Diagnosis Date    Esophageal reflux     Essential hypertension      History reviewed. No pertinent surgical history.  Family Medical History:    None         Social History     Tobacco Use    Smoking status: Never    Smokeless tobacco: Current   Substance Use Topics    Alcohol use: Yes     Alcohol/week: 14.0 standard drinks     Types: 14 Cans of beer per week     Comment: States drinks couple of beers every day    Drug use: Never       Review of Systems:  Review of Systems    Physical Exam:  Ht 1.778 m (5\' 10" )   Wt 99.8 kg (220 lb)   BMI 31.57 kg/m       Physical Exam  Constitutional:       Appearance: Normal appearance. He is well-developed, well-groomed and normal weight.   HENT:      Head: Normocephalic and atraumatic.      Right Ear: Hearing, tympanic membrane, ear canal and external ear normal. There is impacted cerumen.      Left Ear: Hearing, tympanic membrane, ear canal and external ear normal. There is impacted cerumen.      Nose:  Septal deviation and mucosal edema present.      Right Turbinates: Enlarged.      Left Turbinates: Enlarged.      Mouth/Throat:      Lips: Pink.      Mouth: Mucous membranes are moist.      Pharynx: Oropharynx is clear. Uvula midline.   Eyes:      Extraocular Movements: Extraocular movements intact.   Neck:      Trachea: Phonation normal.   Pulmonary:      Effort: Pulmonary effort is normal.   Musculoskeletal:      Cervical back: Normal range of motion and neck supple.   Lymphadenopathy:      Cervical: No cervical adenopathy.   Skin:     General: Skin is warm.   Neurological:      Mental Status: He is alert and oriented to person, place, and time.      Cranial Nerves: Cranial nerves 2-12 are intact. No facial asymmetry.   Psychiatric:         Attention and Perception: Attention normal.         Mood and Affect: Mood normal.         Speech: Speech normal.         Behavior: Behavior normal. Behavior is cooperative.       Assessment:  ENCOUNTER DIAGNOSES     ICD-10-CM   1. DNS (deviated nasal septum)  J34.2   2. Allergic rhinitis, unspecified seasonality, unspecified trigger  J30.9   3. Tongue lesion  K14.8   4. Bilateral impacted cerumen  H61.23       Plan:  Medical records reviewed on 05/14/2022.  AU cerumen debrided. Rx Dermotic. Reviewed Brain MRI. Will order Audiogram. No sign of acute sinusitis.  Continue Flonase.    Orders Placed This Encounter    [phone] - NASAL ENDOSCOPY DIAGNOSTIC UNILATERAL OR BILATERAL (AMB ONLY)    51761 - REMOVAL IMPACTED CERUMEN W/ INSTRUMENT, UNILATERAL (AMB ONLY-PD)    Referral to [facility]    Fluocinolone Acetonide Oil (DERMOTIC OIL) 0.01 % Otic Drops  Return for Follow up after audiogram.    Mathis Dad, PA-C  05/14/2022, 10:43   The advanced practice clinician's documentation was reviewed/amended in its entirety with the assessment and plan portion completely performed independently by me during this separate encounter.

## 2022-05-14 NOTE — Procedures (Signed)
ENT, [facility]  [address]  [place] [address]    Procedure Note    Name: Evan Bell MRN:  [id number]   Date: 05/14/2022 Age: 71 y.o.  DOB:   10-04-1950       31231 - NASAL ENDOSCOPY DIAGNOSTIC UNILATERAL OR BILATERAL (AMB ONLY)  Performed by: Conchita Paris, DO  Authorized by: Conchita Paris, DO     Time Out:     Immediately before the procedure, a time out was called:  Yes    Patient verified:  Yes    Procedure Verified:  Yes    Site Verified:  Yes  Documentation:      Indications for procedure: Monitor chronic disease    Anesthesia: Oxymetazoline nasal spray    Description: Nasal endoscopy with rigid scope was performed with examination of the  septum, inferior, middle, and superior meatus, turbinates, sphenoethmoidal recess, and nasopharynx.     There were no polyps, pus, or granulation tissue noted.  ET orifices and nasopharynx were normal.     Findings: Allergic rhinitis    The patient tolerated the procedure well.           46803 - REMOVAL IMPACTED CERUMEN W/ INSTRUMENT, UNILATERAL (AMB ONLY-PD)  Performed by: Conchita Paris, DO  Authorized by: Conchita Paris, DO     Time Out:     Immediately before the procedure, a time out was called:  Yes    Patient verified:  Yes    Procedure Verified:  Yes    Site Verified:  Yes  Documentation:      Procedure: Cerumen cleaning  Pre-op Dx: Cerumen impaction      Bilateral EAC(s) examined under binocular microscopy.  Cerumen and/or debris was cleaned from the canal(s) using curettes, suction, and alligator forceps.  Patient tolerated procedure well.     Conchita Paris, DO

## 2022-05-18 ENCOUNTER — Other Ambulatory Visit (INDEPENDENT_AMBULATORY_CARE_PROVIDER_SITE_OTHER): Payer: Self-pay | Admitting: OTOLARYNGOLOGY

## 2022-05-18 MED ORDER — FLUOCINOLONE ACETONIDE OIL 0.01 % EAR DROPS
OTIC | 3 refills | Status: DC
Start: 2022-05-18 — End: 2023-11-14

## 2022-06-18 ENCOUNTER — Encounter (INDEPENDENT_AMBULATORY_CARE_PROVIDER_SITE_OTHER): Payer: Self-pay | Admitting: OTOLARYNGOLOGY

## 2022-07-04 DIAGNOSIS — I440 Atrioventricular block, first degree: Secondary | ICD-10-CM

## 2022-07-04 DIAGNOSIS — I509 Heart failure, unspecified: Secondary | ICD-10-CM

## 2022-07-04 LAB — ECG 12 LEAD
Atrial Rate: 81 {beats}/min
Calculated P Axis: 58 degrees
Calculated R Axis: 24 degrees
Calculated T Axis: 17 degrees
PR Interval: 246 ms
QRS Duration: 102 ms
QT Interval: 370 ms
QTC Calculation: 429 ms
Ventricular rate: 81 {beats}/min

## 2022-07-27 ENCOUNTER — Ambulatory Visit (HOSPITAL_COMMUNITY): Payer: Self-pay

## 2022-09-05 ENCOUNTER — Ambulatory Visit (HOSPITAL_COMMUNITY): Payer: Self-pay

## 2023-01-23 IMAGING — MR MRI LUMBAR SPINE WITHOUT CONTRAST
4 of 6 series · 31 of 48 positions shown · IV contrast (gadolinium)
Comparison: Outside report of lumbar spine x-ray dated 01/10/2023.

﻿EXAM:  68263   MRI LUMBAR SPINE WITHOUT CONTRAST
INDICATION: Persistent low back pain. Radiculopathy.  Bilateral hip and lower extremity pain.  No prior back surgery.
TECHNIQUE: Multiplanar, multisequential MRI of the lumbosacral spine was performed without gadolinium contrast.

[Series 17: T2 · sagittal · 4.0mm · 0.94mm/px · 6 of 13 slices shown (1 of 3)]
[im 1/13]
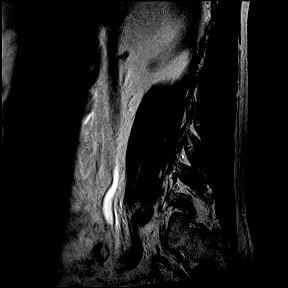
[im 3/13]
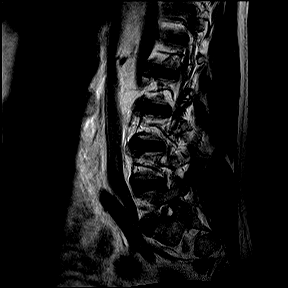
[im 5/13]
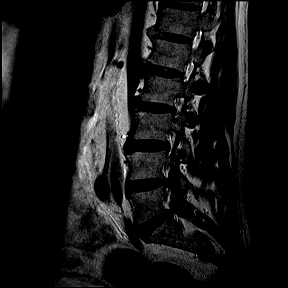
[im 8/13]
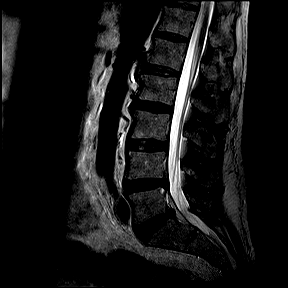
[im 10/13]
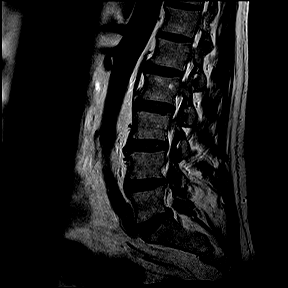
[im 13/13]
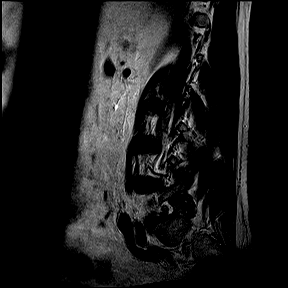

[Series 18: T1 · sagittal · 4.0mm · 0.94mm/px · 6 of 13 slices shown]
[im 1/13]
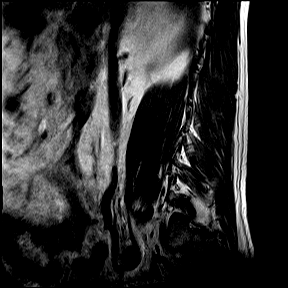
[im 3/13]
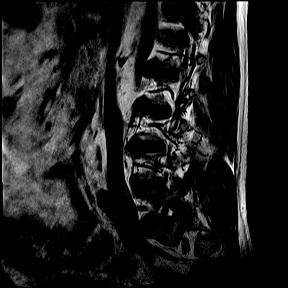
[im 5/13]
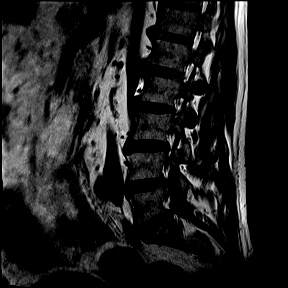
[im 8/13]
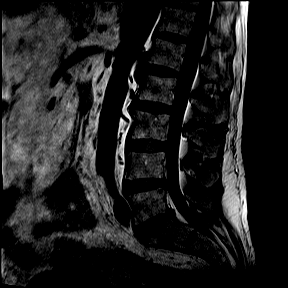
[im 10/13]
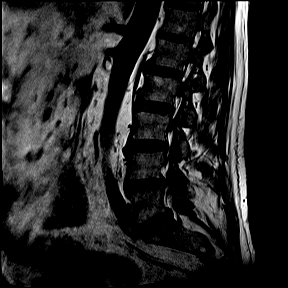
[im 13/13]
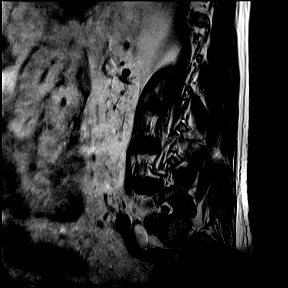

[Series 20: T2 · coronal · 5.0mm · 0.82mm/px · 8 of 18 slices shown (2 of 3)]
[im 1/18]
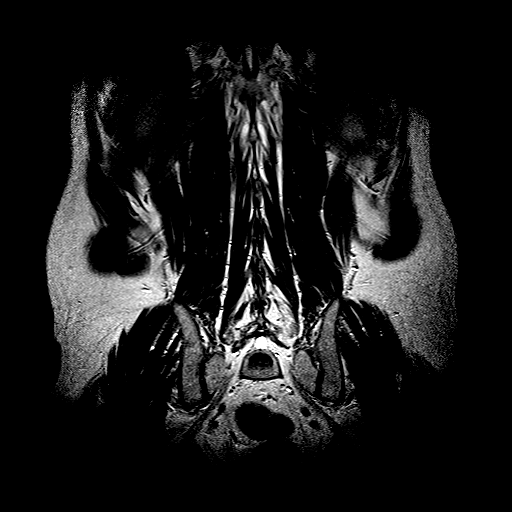
[im 3/18]
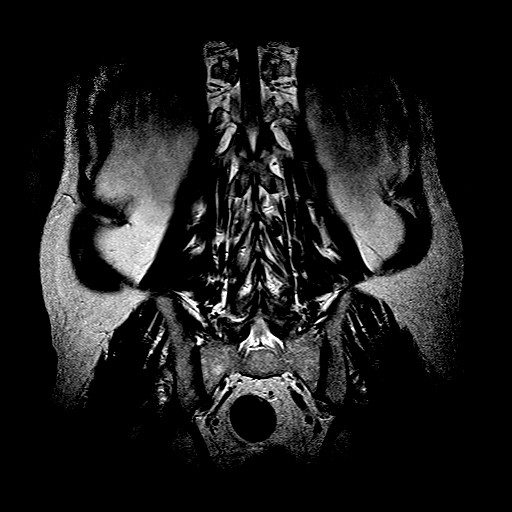
[im 5/18]
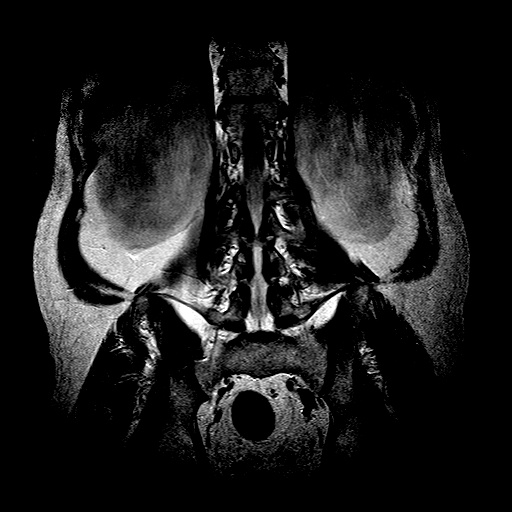
[im 8/18]
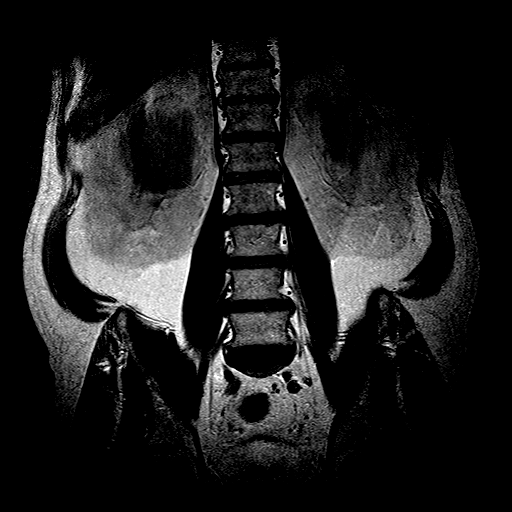
[im 10/18]
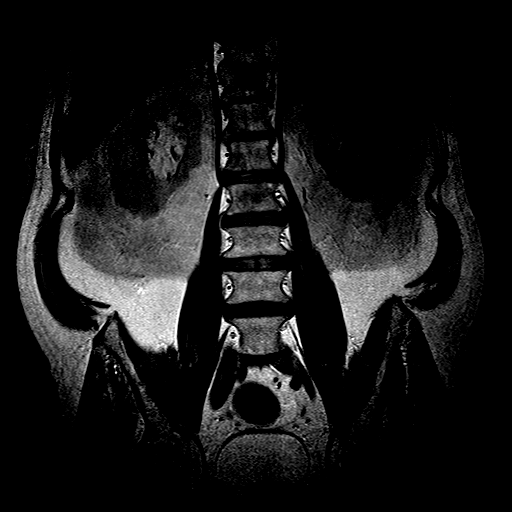
[im 13/18]
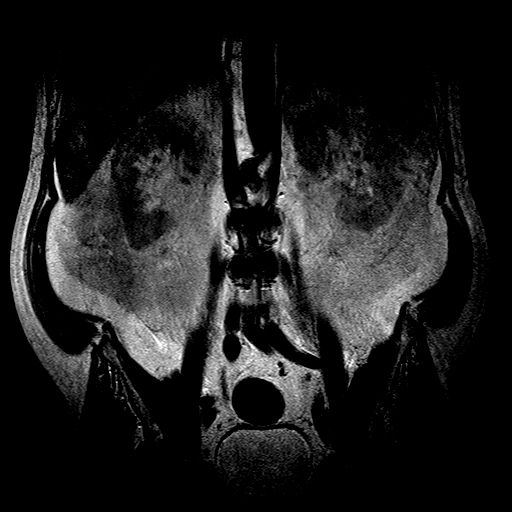
[im 15/18]
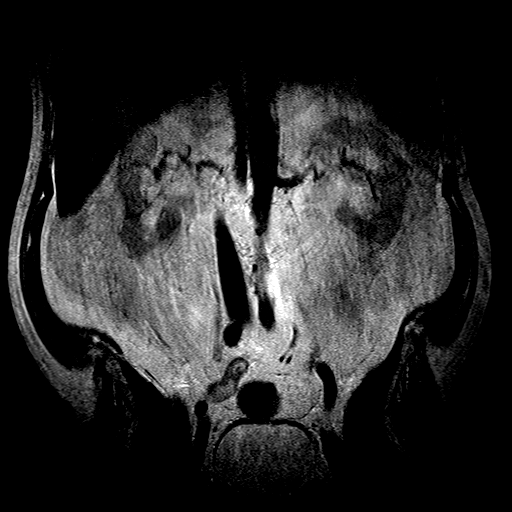
[im 18/18]
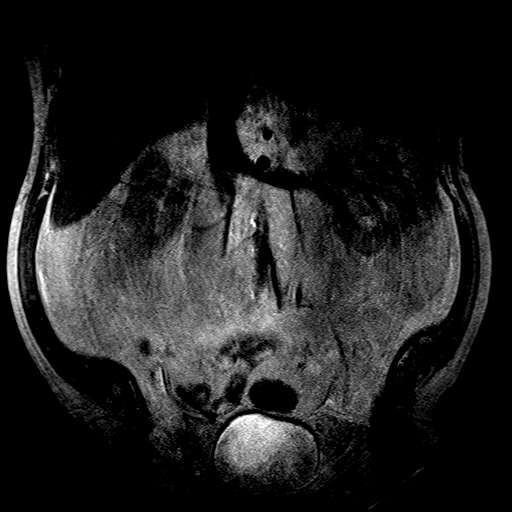

[Series 22: T2 · axial · 4.0mm · 0.52mm/px · z∈[-168,+57]mm · 11 of 23 slices shown (3 of 3)]
[im 1/23]
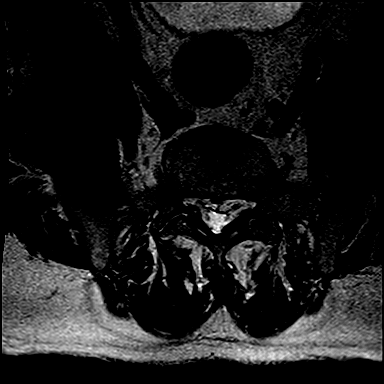
[im 3/23]
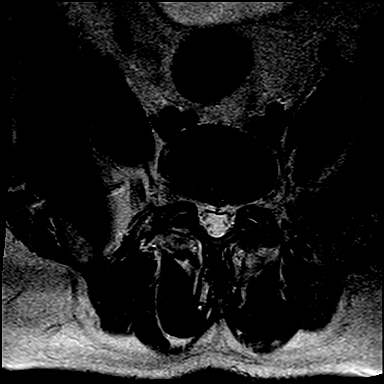
[im 5/23]
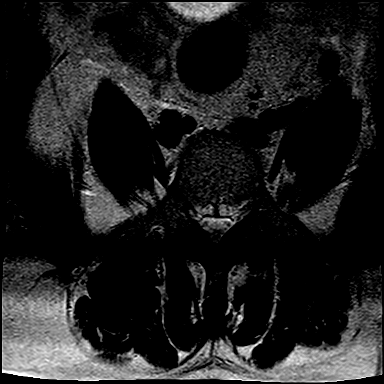
[im 7/23]
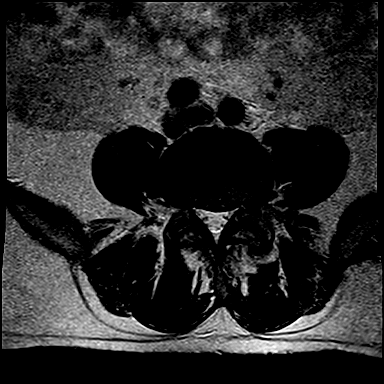
[im 9/23]
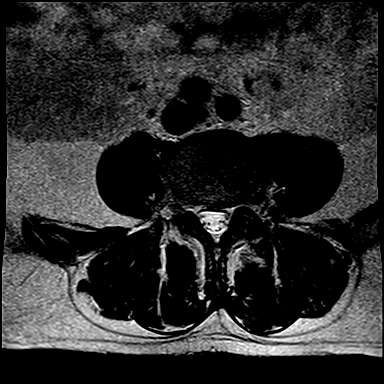
[im 12/23]
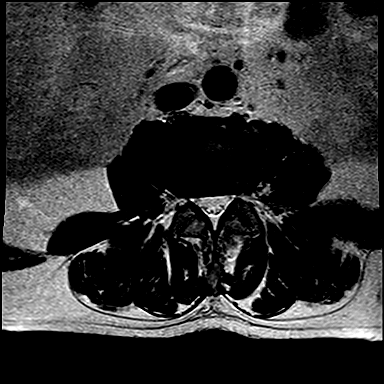
[im 14/23]
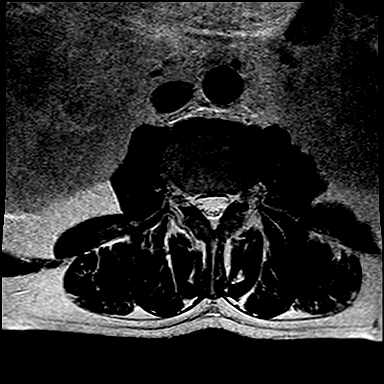
[im 16/23]
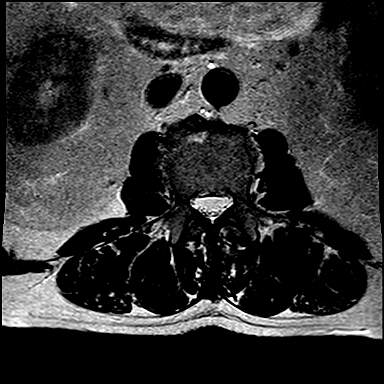
[im 18/23]
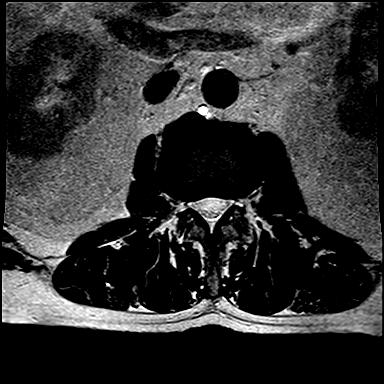
[im 20/23]
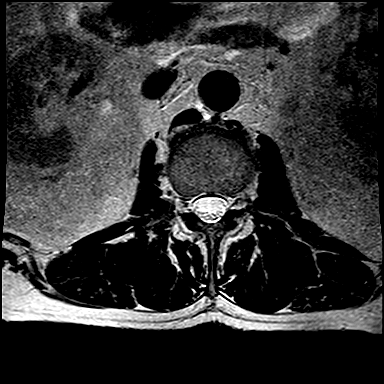
[im 23/23]
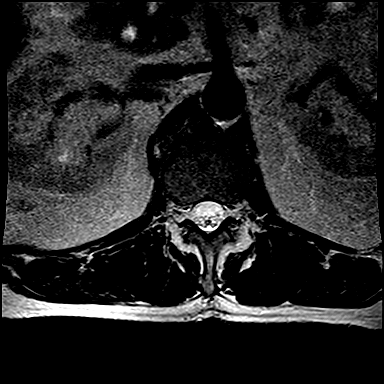

[31 of 48 positions shown; findings below may reference images not displayed]

FINDINGS: No acute bony lesions of lumbar vertebrae are seen.  Conus medullaris and cauda equina structures are normal in the sagittal projection. 

At L1-2 level, no focal disc lesions are seen. 

At L2-3 level, mild degenerative disc changes and facet arthropathy are noted without compromise of thecal sac or lateral recess.

At L3-4 level, mild to moderate degenerative disc changes and facet arthropathy are noted causing mild compromise of both dorsal lateral recess.  AP diameter of thecal sac in the midline measures 10 mm. 

At L4-5 level, significant degenerative disc disease with bulging annulus and significant facet arthropathy are noted.  Annulus tear is noted in the midline with mild focal disc bulge.  There is significant compromise of left lateral recess and neural foramen and moderately significant compromise of right neural foramen at this level. Mild compromise of thecal sac in the midline with AP diameter measuring 9.5 mm.

At L5-S1 level, bilateral facet arthropathy is noted with minimal degenerative anterolisthesis. Bulging annulus and facet arthropathy are causing significant right foraminal and lateral recess narrowing and moderate left foraminal narrowing at this level.

Paravertebral soft tissues are unremarkable.
IMPRESSION: 1.  No acute bone changes of lumbar vertebrae. 

2.  At L4-5 level, significant degenerative disc disease with bulging annulus and significant facet arthropathy are noted.  Annulus tear is noted in the midline with mild focal disc bulge.  There is significant compromise of left lateral recess and neural foramen and moderately significant compromise of right neural foramen at this level. Mild compromise of thecal sac in the midline with AP diameter measuring 9.5 mm.

3.At L5-S1 level, bilateral facet arthropathy is noted with minimal degenerative anterolisthesis. Bulging annulus and facet arthropathy are causing significant right foraminal and lateral recess narrowing and moderate left foraminal narrowing at this level.

4. Findings at other disc levels are described above in detail.

## 2023-05-22 ENCOUNTER — Other Ambulatory Visit (HOSPITAL_COMMUNITY): Payer: Self-pay | Admitting: Family

## 2023-05-22 DIAGNOSIS — K760 Fatty (change of) liver, not elsewhere classified: Secondary | ICD-10-CM

## 2023-06-06 ENCOUNTER — Encounter (HOSPITAL_COMMUNITY): Payer: Self-pay

## 2023-06-06 ENCOUNTER — Emergency Department
Admission: EM | Admit: 2023-06-06 | Discharge: 2023-06-06 | Disposition: A | Discharge status: Home / Self Care | Payer: Medicare Other | Attending: Physician Assistant | Admitting: Physician Assistant

## 2023-06-06 ENCOUNTER — Other Ambulatory Visit: Payer: Self-pay

## 2023-06-06 ENCOUNTER — Emergency Department (HOSPITAL_COMMUNITY): Payer: Medicare Other

## 2023-06-06 DIAGNOSIS — X509XXA Other and unspecified overexertion or strenuous movements or postures, initial encounter: Secondary | ICD-10-CM | POA: Insufficient documentation

## 2023-06-06 DIAGNOSIS — M51362 Other intervertebral disc degeneration, lumbar region with discogenic back pain and lower extremity pain: Secondary | ICD-10-CM | POA: Insufficient documentation

## 2023-06-06 DIAGNOSIS — M51369 Other intervertebral disc degeneration, lumbar region without mention of lumbar back pain or lower extremity pain: Secondary | ICD-10-CM

## 2023-06-06 MED ORDER — HYDROCODONE 7.5 MG-ACETAMINOPHEN 325 MG TABLET
1.0000 | ORAL_TABLET | Freq: Four times a day (QID) | ORAL | 0 refills | Status: DC | PRN
Start: 2023-06-06 — End: 2024-07-14

## 2023-06-06 MED ORDER — HYDROCODONE 7.5 MG-ACETAMINOPHEN 325 MG TABLET
1.0000 | ORAL_TABLET | Freq: Once | ORAL | Status: AC
Start: 2023-06-06 — End: 2023-06-06
  Administered 2023-06-06: 1 via ORAL

## 2023-06-06 MED ORDER — DEXAMETHASONE SODIUM PHOSPHATE 4 MG/ML INJECTION SOLUTION
4.0000 mg | INTRAMUSCULAR | Status: AC
Start: 2023-06-06 — End: 2023-06-06
  Administered 2023-06-06: 4 mg via INTRAMUSCULAR

## 2023-06-06 MED ORDER — HYDROCODONE 7.5 MG-ACETAMINOPHEN 325 MG TABLET
ORAL_TABLET | ORAL | Status: AC
Start: 2023-06-06 — End: 2023-06-06
  Filled 2023-06-06: qty 1

## 2023-06-06 MED ORDER — METHYLPREDNISOLONE ACETATE 40 MG/ML SUSPENSION FOR INJECTION
40.0000 mg | Freq: Once | INTRAMUSCULAR | Status: AC
Start: 2023-06-06 — End: 2023-06-06
  Administered 2023-06-06: 40 mg via INTRAMUSCULAR
  Filled 2023-06-06: qty 1

## 2023-06-06 MED ORDER — DEXAMETHASONE SODIUM PHOSPHATE 4 MG/ML INJECTION SOLUTION
INTRAMUSCULAR | Status: AC
Start: 2023-06-06 — End: 2023-06-06
  Filled 2023-06-06: qty 1

## 2023-06-06 NOTE — ED Provider Notes (Signed)
Emergency Medicine      Name: Evan Bell  Age and Gender: 72 y.o. male  Date of Birth: 1951-04-15  MRN: [id number]  PCP: Pcp Not In System    CC:  Chief Complaint   Patient presents with    Back Pain       HPI:  Evan Bell is a 72 y.o. White male who presents to the ER with low back pain. Patient states he picked his wife up 10 days ago after she had fallen and injured his low back. He says he had sudden onset of back pain at that time, but developed paresthesias of the bottoms of his feet about 5 days ago. He says he feels like his legs are going to give out when he walks. He denies any saddle anesthesia or loss of control of his bowels/bladder. Patient reports his back pain radiates to his right hip. He endorses prior similar episode several years ago in [place] requiring ultrasound guided injections into his back.    Below pertinent information reviewed with patient:  Past Medical History:   Diagnosis Date    Esophageal reflux     Essential hypertension            Allergies   Allergen Reactions    Buspirone  Other Adverse Reaction (Add comment)    Prazosin Mental Status Effect    Sertraline      Other reaction(s): Numbness       Past Surgical History:   Procedure Laterality Date    CORONARY ANGIOGRAPHY N/A 01/29/2022    Performed by [name], MD at [facility]        Social History     Socioeconomic History    Marital status: Married   Tobacco Use    Smoking status: Never    Smokeless tobacco: Current   Substance and Sexual Activity    Alcohol use: Yes     Alcohol/week: 14.0 standard drinks of alcohol     Types: 14 Cans of beer per week     Comment: States drinks couple of beers every day    Drug use: Never       ROS:  No other overt positive review of systems are noted other than stated in the HPI.      Objective:    ED Triage Vitals [06/06/23 0619]   BP (Non-Invasive) (!) 177/71   Heart Rate 77   Respiratory Rate 18   Temperature 36.9 C (98.4 F)   SpO2 97 %   Weight 93.4 kg (206 lb)   Height  1.778 m (5\' 10" )     Filed Vitals:    06/06/23 0619 06/06/23 0930   BP: (!) 177/71 (!) 157/75   Pulse: 77 71   Resp: 18 18   Temp: 36.9 C (98.4 F) 36.9 C (98.4 F)   SpO2: 97% 97%       Nursing notes and vital signs reviewed.    Constitutional - No acute distress.  Alert and Active.  HEENT - Normocephalic. Conjunctiva clear. Moist mucous membranes.   Neck - Trachea midline. No stridor. No hoarseness.  Cardiac - Regular rate and rhythm. No murmurs, rubs, or gallops.   Respiratory/Chest - Normal respiratory effort. Clear to auscultation bilaterally. No rales, wheezes or rhonchi.   Musculoskeletal - Good AROM. Tenderness over the lower lumbar spinous processes. No clubbing, cyanosis or edema.  Skin - Warm and dry, without any rashes or other lesions.  Neuro - Alert and  oriented x 3. Moving all extremities symmetrically with intact strength of the bilateral lower extremities. Normal gait. 1+ patellar, achilles and babinski reflexes as assessed by myself and Dr Santiago Bumpers.  Psych - Normal mood and affect. Behavior is normal          Any pertinent labs and imaging obtained during this encounter reviewed below in MDM.    MDM/ED Course:      Medical Decision Making  Patient presents to the ER with low back pain. Patient states he picked his wife up 10 days ago after she had fallen and injured his low back. He says he had sudden onset of back pain at that time, but developed paresthesias of the bottoms of his feet about 5 days ago. He says he feels like his legs are going to give out when he walks. He denies any saddle anesthesia or loss of control of his bowels/bladder. Patient reports his back pain radiates to his right hip. He endorses prior similar episode several years ago in [place] requiring ultrasound guided injections into his back. Differential diagnoses include sciatica, lumbar strain, bulging disc, lumbar DDD. Patient examined by myself and Dr Santiago Bumpers, who felt imaging with CT was appropriate. Patient underwent  diagnostics with results as noted in ED course. Patient given injection of a mixture of dexamethasone and depo-medrol as well as a prescription for Norco for pain. He has an upcoming appointment with [facility] on 10/28 and I have encouraged him to see if he can get on their cancellation list for an earlier appointment. Imaging put on a disc for the patient. He will be discharged in stable condition.    Amount and/or Complexity of Data Reviewed  Radiology: ordered. Decision-making details documented in ED Course.    Risk  Prescription drug management.        ED Course as of 06/06/23 1027   Thu Jun 06, 2023   0820 CT LUMBAR SPINE WO IV CONTRAST  MULTILEVEL DEGENERATIVE DISC AND JOINT DISEASE RESULTING IN VARYING DEGREES OF SPINAL CANAL AND NEURAL FORAMINAL NARROWING AS DESCRIBED       Orders Placed This Encounter    CT LUMBAR SPINE WO IV CONTRAST    HYDROcodone-acetaminophen (NORCO) 7.5 mg-325 mg per tablet    methylPREDNISolone acetate (DEPO-medrol) 40 mg/mL injection    dexAMETHasone 4 mg/mL injection    HYDROcodone-acetaminophen (NORCO) 7.5-325 mg Oral Tablet         Impression:   Clinical Impression   Bulging of lumbar intervertebral disc (Primary)   Degeneration of intervertebral disc of lumbar region with discogenic back pain and lower extremity pain       Disposition: Discharged    / Maryagnes Amos PA-C  06/06/2023, 07:23  [facility]  Department of Emergency Medicine  [facility]    Portions of this note may have been dictated using voice recognition software.     -----------------------  No results found for this or any previous visit (from the past 12 hour(s)).  CT LUMBAR SPINE WO IV CONTRAST   Final Result   MULTILEVEL DEGENERATIVE DISC AND JOINT DISEASE RESULTING IN VARYING DEGREES OF SPINAL CANAL AND NEURAL FORAMINAL NARROWING AS DESCRIBED.         One or more dose reduction techniques were used (e.g., Automated exposure control, adjustment of the mA and/or kV according  to patient size, use of iterative reconstruction technique).         Radiologist location ID: [id number]

## 2023-06-06 NOTE — Discharge Instructions (Signed)
Follow up with your primary care provider at the [facility] who can help arrange further evaluation and treatment.  Take Norco as needed for pain.  Return to the ER for other emergencies or as needed.

## 2023-06-06 NOTE — ED Triage Notes (Signed)
"  My wife fell and I picked her up" about ten days ago, lower back pain since incident, no relief with OTC meds, reports toes are going numb and bilateral leg weakness x 5 days, denies loss of control of bowel/bladder

## 2023-06-06 NOTE — ED Nurses Note (Signed)
 Patient discharged home.  AVS reviewed with patient/care giver.  A written copy of the AVS and discharge instructions was given to the patient/care giver.  Questions sufficiently answered as needed.  Patient/care giver encouraged to follow up with PCP as indicated.  In the event of an emergency, patient/care giver instructed to call 911 or go to the nearest emergency room.  Patient left ED via ambulation.

## 2023-08-05 ENCOUNTER — Ambulatory Visit (HOSPITAL_COMMUNITY): Payer: Self-pay

## 2023-11-14 ENCOUNTER — Emergency Department (HOSPITAL_COMMUNITY)

## 2023-11-14 ENCOUNTER — Inpatient Hospital Stay (HOSPITAL_COMMUNITY): Admitting: Internal Medicine

## 2023-11-14 ENCOUNTER — Encounter (HOSPITAL_BASED_OUTPATIENT_CLINIC_OR_DEPARTMENT_OTHER): Payer: Self-pay

## 2023-11-14 ENCOUNTER — Other Ambulatory Visit: Payer: Self-pay

## 2023-11-14 ENCOUNTER — Emergency Department (HOSPITAL_BASED_OUTPATIENT_CLINIC_OR_DEPARTMENT_OTHER)

## 2023-11-14 ENCOUNTER — Inpatient Hospital Stay
Admission: EM | Admit: 2023-11-14 | Discharge: 2023-11-15 | DRG: 392 | Discharge status: Against Medical Advice | Attending: HOSPITALIST | Admitting: HOSPITALIST

## 2023-11-14 DIAGNOSIS — R197 Diarrhea, unspecified: Principal | ICD-10-CM

## 2023-11-14 DIAGNOSIS — E860 Dehydration: Secondary | ICD-10-CM | POA: Diagnosis present

## 2023-11-14 DIAGNOSIS — K529 Noninfective gastroenteritis and colitis, unspecified: Principal | ICD-10-CM | POA: Diagnosis present

## 2023-11-14 DIAGNOSIS — F1020 Alcohol dependence, uncomplicated: Secondary | ICD-10-CM | POA: Diagnosis present

## 2023-11-14 DIAGNOSIS — F17290 Nicotine dependence, other tobacco product, uncomplicated: Secondary | ICD-10-CM

## 2023-11-14 DIAGNOSIS — Z1152 Encounter for screening for COVID-19: Secondary | ICD-10-CM

## 2023-11-14 DIAGNOSIS — K2970 Gastritis, unspecified, without bleeding: Secondary | ICD-10-CM | POA: Diagnosis present

## 2023-11-14 DIAGNOSIS — R509 Fever, unspecified: Secondary | ICD-10-CM

## 2023-11-14 DIAGNOSIS — Z7984 Long term (current) use of oral hypoglycemic drugs: Secondary | ICD-10-CM

## 2023-11-14 DIAGNOSIS — W19XXXA Unspecified fall, initial encounter: Secondary | ICD-10-CM | POA: Diagnosis present

## 2023-11-14 DIAGNOSIS — K279 Peptic ulcer, site unspecified, unspecified as acute or chronic, without hemorrhage or perforation: Secondary | ICD-10-CM | POA: Diagnosis present

## 2023-11-14 DIAGNOSIS — Z5329 Procedure and treatment not carried out because of patient's decision for other reasons: Secondary | ICD-10-CM | POA: Diagnosis present

## 2023-11-14 DIAGNOSIS — Z9181 History of falling: Secondary | ICD-10-CM

## 2023-11-14 DIAGNOSIS — E119 Type 2 diabetes mellitus without complications: Secondary | ICD-10-CM | POA: Diagnosis present

## 2023-11-14 DIAGNOSIS — Z556 Problems related to health literacy: Secondary | ICD-10-CM

## 2023-11-14 DIAGNOSIS — E871 Hypo-osmolality and hyponatremia: Secondary | ICD-10-CM | POA: Diagnosis present

## 2023-11-14 DIAGNOSIS — R110 Nausea: Secondary | ICD-10-CM

## 2023-11-14 DIAGNOSIS — Z79899 Other long term (current) drug therapy: Secondary | ICD-10-CM

## 2023-11-14 DIAGNOSIS — R61 Generalized hyperhidrosis: Secondary | ICD-10-CM

## 2023-11-14 DIAGNOSIS — R531 Weakness: Secondary | ICD-10-CM

## 2023-11-14 DIAGNOSIS — A419 Sepsis, unspecified organism: Secondary | ICD-10-CM

## 2023-11-14 DIAGNOSIS — Z800 Family history of malignant neoplasm of digestive organs: Secondary | ICD-10-CM

## 2023-11-14 HISTORY — DX: Type 2 diabetes mellitus without complications: E11.9

## 2023-11-14 HISTORY — DX: Contact with and (suspected) exposure to other war theater: Z77.39

## 2023-11-14 HISTORY — DX: Contact with and (suspected) exposure to other hazardous, chiefly nonmedicinal, chemicals: Z77.098

## 2023-11-14 HISTORY — DX: Pure hypercholesterolemia, unspecified: E78.00

## 2023-11-14 LAB — BLUE TOP TUBE

## 2023-11-14 LAB — BASIC METABOLIC PANEL
ANION GAP: 9 mmol/L (ref 4–13)
BUN/CREA RATIO: 12
BUN: 11 mg/dL (ref 7–18)
CALCIUM: 8.8 mg/dL (ref 8.5–10.1)
CHLORIDE: 93 mmol/L — ABNORMAL LOW (ref 98–107)
CO2 TOTAL: 26 mmol/L (ref 21–32)
CREATININE: 0.92 mg/dL (ref 0.70–1.30)
ESTIMATED GFR: 87 mL/min/{1.73_m2} (ref >59)
GLUCOSE: 112 mg/dL — ABNORMAL HIGH (ref 74–106)
OSMOLALITY, CALCULATED: 257 mosm/kg — ABNORMAL LOW (ref 270–290)
POTASSIUM: 3.7 mmol/L (ref 3.5–5.1)
SODIUM: 128 mmol/L — ABNORMAL LOW (ref 136–145)

## 2023-11-14 LAB — GRAY TOP TUBE

## 2023-11-14 LAB — URINALYSIS, MACROSCOPIC
BILIRUBIN: NEGATIVE mg/dL
GLUCOSE: NEGATIVE mg/dL
LEUKOCYTES: NEGATIVE WBCs/uL
NITRITE: NEGATIVE
PH: 7 (ref 4.6–8.0)
SPECIFIC GRAVITY: 1.01 (ref 1.003–1.035)
UROBILINOGEN: 2 mg/dL (ref 0.2–1.0)

## 2023-11-14 LAB — COVID-19, FLU A/B, RSV RAPID BY PCR
INFLUENZA VIRUS TYPE A: NOT DETECTED
INFLUENZA VIRUS TYPE B: NOT DETECTED
RESPIRATORY SYNCTIAL VIRUS (RSV): NOT DETECTED
SARS-CoV-2: NOT DETECTED

## 2023-11-14 LAB — HEPATIC FUNCTION PANEL
ALBUMIN/GLOBULIN RATIO: 0.8 (ref 0.8–1.4)
ALBUMIN: 2.9 g/dL — ABNORMAL LOW (ref 3.4–5.0)
ALKALINE PHOSPHATASE: 68 U/L (ref 46–116)
ALT (SGPT): 21 U/L (ref <=78)
AST (SGOT): 24 U/L (ref 15–37)
BILIRUBIN DIRECT: 0.4 mg/dL — ABNORMAL HIGH (ref 0.0–0.2)
BILIRUBIN TOTAL: 1.2 mg/dL — ABNORMAL HIGH (ref 0.2–1.0)
BILIRUBIN, INDIRECT: 0.8 mg/dL
GLOBULIN: 3.7
PROTEIN TOTAL: 6.6 g/dL (ref 6.4–8.2)

## 2023-11-14 LAB — MAGNESIUM: MAGNESIUM: 1.4 mg/dL — ABNORMAL LOW (ref 1.8–2.4)

## 2023-11-14 LAB — URINALYSIS, MICROSCOPIC

## 2023-11-14 LAB — LACTIC ACID LEVEL W/ REFLEX FOR LEVEL >2.0: LACTIC ACID: 1.3 mmol/L (ref 0.4–2.0)

## 2023-11-14 LAB — POC BLOOD GLUCOSE (RESULTS): GLUCOSE, POC: 172 mg/dL — ABNORMAL HIGH (ref 70–100)

## 2023-11-14 LAB — TROPONIN-I: TROPONIN I: 18 ng/L (ref <20)

## 2023-11-14 LAB — ETHANOL, SERUM/PLASMA: ETHANOL: <10 mg/dL

## 2023-11-14 MED ORDER — MAGNESIUM SULFATE 1 GRAM/100 ML IN DEXTROSE 5 % INTRAVENOUS PIGGYBACK
1.0000 g | INJECTION | Freq: Once | INTRAVENOUS | Status: AC
Start: 2023-11-14 — End: 2023-11-14
  Administered 2023-11-14: 0 g via INTRAVENOUS
  Administered 2023-11-14: 1 g via INTRAVENOUS

## 2023-11-14 MED ORDER — SODIUM CHLORIDE 0.9 % (FLUSH) INJECTION SYRINGE
3.0000 mL | INJECTION | Freq: Three times a day (TID) | INTRAMUSCULAR | Status: DC
Start: 2023-11-14 — End: 2023-11-14
  Administered 2023-11-14: 3 mL

## 2023-11-14 MED ORDER — DEXTROSE 50 % IN WATER (D50W) INTRAVENOUS SYRINGE
12.5000 g | INJECTION | INTRAVENOUS | Status: DC | PRN
Start: 2023-11-14 — End: 2023-11-15

## 2023-11-14 MED ORDER — ACETAMINOPHEN 325 MG TABLET
650.0000 mg | ORAL_TABLET | ORAL | Status: AC
Start: 2023-11-14 — End: 2023-11-14
  Administered 2023-11-14: 650 mg via ORAL

## 2023-11-14 MED ORDER — SODIUM CHLORIDE 0.9 % INTRAVENOUS PIGGYBACK
4.5000 g | INTRAVENOUS | Status: AC
Start: 2023-11-14 — End: 2023-11-14
  Administered 2023-11-14: 0 g via INTRAVENOUS
  Administered 2023-11-14: 4.5 g via INTRAVENOUS

## 2023-11-14 MED ORDER — ALUMINUM-MAG HYDROXIDE-SIMETHICONE 200 MG-200 MG-20 MG/5 ML ORAL SUSP
30.0000 mL | ORAL | Status: DC | PRN
Start: 2023-11-14 — End: 2023-11-15

## 2023-11-14 MED ORDER — SODIUM CHLORIDE 0.9 % IV BOLUS
1000.0000 mL | INJECTION | Status: AC
Start: 2023-11-14 — End: 2023-11-14
  Administered 2023-11-14: 1000 mL via INTRAVENOUS
  Administered 2023-11-14: 0 mL via INTRAVENOUS

## 2023-11-14 MED ORDER — INSULIN LISPRO 100 UNIT/ML SUB-Q SSIP VIAL
1.0000 [IU] | INJECTION | Freq: Four times a day (QID) | SUBCUTANEOUS | Status: DC
Start: 2023-11-14 — End: 2023-11-15
  Administered 2023-11-14 – 2023-11-15 (×2): 1 [IU] via SUBCUTANEOUS
  Administered 2023-11-15: 0 [IU] via SUBCUTANEOUS
  Filled 2023-11-14: qty 1

## 2023-11-14 MED ORDER — SODIUM CHLORIDE 0.9 % (FLUSH) INJECTION SYRINGE
3.0000 mL | INJECTION | INTRAMUSCULAR | Status: DC | PRN
Start: 2023-11-14 — End: 2023-11-15

## 2023-11-14 MED ORDER — HEPARIN (PORCINE) 5,000 UNIT/ML INJECTION SOLUTION
5000.0000 [IU] | Freq: Three times a day (TID) | INTRAMUSCULAR | Status: DC
Start: 2023-11-14 — End: 2023-11-15
  Administered 2023-11-14 – 2023-11-15 (×3): 5000 [IU] via SUBCUTANEOUS
  Filled 2023-11-14 (×2): qty 1

## 2023-11-14 MED ORDER — ACETAMINOPHEN 325 MG TABLET
ORAL_TABLET | ORAL | Status: AC
Start: 2023-11-14 — End: 2023-11-14
  Filled 2023-11-14: qty 2

## 2023-11-14 MED ORDER — PIPERACILLIN-TAZOBACTAM 4.5 GRAM INTRAVENOUS SOLUTION
INTRAVENOUS | Status: AC
Start: 2023-11-14 — End: 2023-11-14
  Filled 2023-11-14: qty 20

## 2023-11-14 MED ORDER — SODIUM CHLORIDE 0.9 % INTRAVENOUS PIGGYBACK
4.5000 g | Freq: Three times a day (TID) | INTRAVENOUS | Status: DC
Start: 2023-11-14 — End: 2023-11-15
  Administered 2023-11-14: 4.5 g via INTRAVENOUS
  Administered 2023-11-15: 0 g via INTRAVENOUS
  Administered 2023-11-15: 4.5 g via INTRAVENOUS
  Administered 2023-11-15: 0 g via INTRAVENOUS
  Filled 2023-11-14 (×2): qty 20

## 2023-11-14 MED ORDER — GLUCAGON HCL 1 MG/ML SOLUTION FOR INJECTION
1.0000 mg | Freq: Once | INTRAMUSCULAR | Status: DC | PRN
Start: 2023-11-14 — End: 2023-11-15

## 2023-11-14 MED ORDER — IPRATROPIUM 0.5 MG-ALBUTEROL 3 MG (2.5 MG BASE)/3 ML NEBULIZATION SOLN
3.0000 mL | INHALATION_SOLUTION | RESPIRATORY_TRACT | Status: DC | PRN
Start: 2023-11-14 — End: 2023-11-15

## 2023-11-14 MED ORDER — SODIUM CHLORIDE 0.9 % IV BOLUS
800.0000 mL | INJECTION | Status: AC
Start: 2023-11-14 — End: 2023-11-14
  Administered 2023-11-14: 800 mL via INTRAVENOUS
  Administered 2023-11-14: 0 mL via INTRAVENOUS

## 2023-11-14 MED ORDER — SODIUM CHLORIDE 0.9 % INTRAVENOUS SOLUTION
INTRAVENOUS | Status: DC
Start: 2023-11-14 — End: 2023-11-15
  Administered 2023-11-15: 0 mL via INTRAVENOUS

## 2023-11-14 MED ORDER — SODIUM CHLORIDE 0.9 % (FLUSH) INJECTION SYRINGE
3.0000 mL | INJECTION | Freq: Three times a day (TID) | INTRAMUSCULAR | Status: DC
Start: 2023-11-14 — End: 2023-11-15
  Administered 2023-11-14: 3 mL
  Administered 2023-11-14: 0 mL
  Administered 2023-11-15: 3 mL

## 2023-11-14 MED ORDER — IOHEXOL 350 MG IODINE/ML INTRAVENOUS SOLUTION
75.0000 mL | INTRAVENOUS | Status: AC
Start: 2023-11-14 — End: 2023-11-14
  Administered 2023-11-14: 75 mL via INTRAVENOUS

## 2023-11-14 MED ORDER — MAGNESIUM SULFATE 1 GRAM/100 ML IN DEXTROSE 5 % INTRAVENOUS PIGGYBACK
INJECTION | INTRAVENOUS | Status: AC
Start: 2023-11-14 — End: 2023-11-14
  Filled 2023-11-14: qty 200

## 2023-11-14 MED ORDER — MAGNESIUM SULFATE 1 GRAM/100 ML IN DEXTROSE 5 % INTRAVENOUS PIGGYBACK
1.0000 g | INJECTION | Freq: Once | INTRAVENOUS | Status: AC
Start: 2023-11-14 — End: 2023-11-14
  Administered 2023-11-14: 1 g via INTRAVENOUS
  Administered 2023-11-14: 0 g via INTRAVENOUS

## 2023-11-14 MED ORDER — ACETAMINOPHEN 325 MG TABLET
650.0000 mg | ORAL_TABLET | ORAL | Status: DC | PRN
Start: 2023-11-14 — End: 2023-11-15
  Administered 2023-11-15: 650 mg via ORAL
  Filled 2023-11-14: qty 2

## 2023-11-14 MED ORDER — DEXTROSE 40 % ORAL GEL
15.0000 g | ORAL | Status: DC | PRN
Start: 2023-11-14 — End: 2023-11-15

## 2023-11-14 MED ORDER — ONDANSETRON HCL (PF) 4 MG/2 ML INJECTION SOLUTION
4.0000 mg | Freq: Four times a day (QID) | INTRAMUSCULAR | Status: DC | PRN
Start: 2023-11-14 — End: 2023-11-15

## 2023-11-14 MED ORDER — HEPARIN (PORCINE) 5,000 UNIT/ML INJECTION SOLUTION
INTRAMUSCULAR | Status: AC
Start: 2023-11-14 — End: 2023-11-14
  Filled 2023-11-14: qty 1

## 2023-11-14 MED ORDER — SODIUM CHLORIDE 0.9 % (FLUSH) INJECTION SYRINGE
3.0000 mL | INJECTION | INTRAMUSCULAR | Status: DC | PRN
Start: 2023-11-14 — End: 2023-11-14

## 2023-11-14 NOTE — ED Nurses Note (Signed)
 Pt sent from [facility] ER for Abd CT due to N/V/D with fever x2 days with increase weakness this AM. Pt ambulate around bed to use urinal. This nurse stayed with pt for safety reasons. Pt ambulate without assistance. C/O LLQ discomfort

## 2023-11-14 NOTE — ED Nurses Note (Signed)
 Report called to [facility]

## 2023-11-14 NOTE — ED Nurses Note (Signed)
 Report called to [facility] ER. BRS out of facility with patient via stretcher x 2 attendants in route to [facility] ER. No acute distress observed. All personal belongings sent with patient. All pertinent paperwork sent with EMS.

## 2023-11-14 NOTE — ED Provider Notes (Signed)
 [facility] - Emergency Department  ED Primary Provider Note  History of Present Illness   Chief Complaint   Patient presents with    Diarrhea     Nausea and diarrhea, gotten dizzy, for 3 days  [place] ems: 20G lt forearm 600 Ns and Zofran  4 mg      Evan Bell is a 73 y.o. male who had concerns including Diarrhea.  Arrival: The patient arrived by Ambulance    Patient is a 73 year old male to the emergency department complaining of diarrhea, fever, weakness.  Patient states it has been ongoing for the past 2 days but worsening today.  Patient states stays unable to get out of bed.  Patient is a additionally complaining of left lower quadrant pain with movement.  Patient denies past medical history of diverticulitis.  Patient rates pain a 5/10 currently.  Patient denies taking medicine prior to arrival for fever.  Patient states sick contacts include family that had norovirus recently.      History Reviewed This Encounter:    Physical Exam   ED Triage Vitals [11/14/23 1102]   BP (Non-Invasive) (!) 185/87   Heart Rate 94   Respiratory Rate 17   Temperature (!) 38.9 C (102.1 F)   SpO2 97 %   Weight 93.4 kg (206 lb)   Height 1.778 m (5\' 10" )     Physical Exam  Vitals and nursing note reviewed.   Constitutional:       General: He is not in acute distress.     Appearance: He is well-developed.   HENT:      Head: Normocephalic and atraumatic.      Right Ear: Tympanic membrane normal.      Left Ear: Tympanic membrane normal.   Eyes:      Conjunctiva/sclera: Conjunctivae normal.   Cardiovascular:      Rate and Rhythm: Normal rate and regular rhythm.      Heart sounds: No murmur heard.  Pulmonary:      Effort: Pulmonary effort is normal. No respiratory distress.      Breath sounds: Normal breath sounds.   Abdominal:      General: Bowel sounds are increased.      Palpations: Abdomen is soft.      Tenderness: There is abdominal tenderness in the right lower quadrant and left lower quadrant.    Musculoskeletal:         General: No swelling.      Cervical back: Neck supple.   Skin:     General: Skin is warm and dry.      Capillary Refill: Capillary refill takes less than 2 seconds.   Neurological:      Mental Status: He is alert.   Psychiatric:         Mood and Affect: Mood normal.       Patient Data     Labs Ordered/Reviewed   BASIC METABOLIC PANEL - Abnormal; Notable for the following components:       Result Value    SODIUM 128 (*)     CHLORIDE 93 (*)     GLUCOSE 112 (*)     OSMOLALITY, CALCULATED 257 (*)     All other components within normal limits    Narrative:     Estimated Glomerular Filtration Rate (eGFR) is calculated using the CKD-EPI (2021) equation, intended for patients 44 years of age and older. If gender is not documented or "unknown", there will be no eGFR calculation.  HEPATIC FUNCTION PANEL - Abnormal; Notable for the following components:    ALBUMIN 2.9 (*)     BILIRUBIN TOTAL 1.2 (*)     BILIRUBIN DIRECT 0.4 (*)     All other components within normal limits   CBC WITH DIFF - Abnormal; Notable for the following components:    WBC 15.4 (*)     RBC 3.90 (*)     HGB 11.8 (*)     HCT 34.3 (*)     RDW 17.6 (*)     PLATELETS 134 (*)     NEUTROPHIL % 89 (*)     LYMPHOCYTE % 4 (*)     EOSINOPHIL % 0 (*)     NEUTROPHIL # 13.67 (*)     LYMPHOCYTE # 0.64 (*)     All other components within normal limits   URINALYSIS, MACROSCOPIC - Abnormal; Notable for the following components:    PROTEIN Trace (*)     KETONES Trace (*)     BLOOD Moderate (*)     All other components within normal limits   URINALYSIS, MICROSCOPIC - Abnormal; Notable for the following components:    BACTERIA Rare (*)     All other components within normal limits   MAGNESIUM  - Abnormal; Notable for the following components:    MAGNESIUM  1.4 (*)     All other components within normal limits   LACTIC ACID LEVEL W/ REFLEX FOR LEVEL >2.0 - Normal   TROPONIN-I - Normal    Narrative:     Values received on females ranging between 12-15  ng/L MUST include the next serial troponin to review changes in the delta differences as the reference range for the Access II chemistry analyzer is lower than the established reference range.     COVID-19, FLU A/B, RSV RAPID BY PCR - Normal    Narrative:     Results are for the simultaneous qualitative identification of SARS-CoV-2 (formerly 2019-nCoV), Influenza A, Influenza B, and RSV RNA. These etiologic agents are generally detectable in nasopharyngeal and nasal swabs during the ACUTE PHASE of infection. Hence, this test is intended to be performed on respiratory specimens collected from individuals with signs and symptoms of upper respiratory tract infection who meet Centers for Disease Control and Prevention (CDC) clinical and/or epidemiological criteria for Coronavirus Disease 2019 (COVID-19) testing. CDC COVID-19 criteria for testing on human specimens is available at [facility] webpage information for Healthcare Professionals: Coronavirus Disease 2019 (COVID-19) ([employer]).     False-negative results may occur if the virus has genomic mutations, insertions, deletions, or rearrangements or if performed very early in the course of illness. Otherwise, negative results indicate virus specific RNA targets are not detected, however negative results do not preclude SARS-CoV-2 infection/COVID-19, Influenza, or Respiratory syncytial virus infection. Results should not be used as the sole basis for patient management decisions. Negative results must be combined with clinical observations, patient history, and epidemiological information. If upper respiratory tract infection is still suspected based on exposure history together with other clinical findings, re-testing should be considered.    Test methodology:   Cepheid Xpert Xpress SARS-CoV-2/Flu/RSV Assay real-time polymerase chain reaction (RT-PCR) test on the GeneXpert Dx and Xpert Xpress systems.   ADULT ROUTINE BLOOD  CULTURE, SET OF 2 BOTTLES (BACTERIA AND YEAST)   ADULT ROUTINE BLOOD CULTURE, SET OF 2 BOTTLES (BACTERIA AND YEAST)   ROUTINE STOOL CULTURE (INCLUDING E. COLI SHIGA TOXIN)   C. DIFFICILE PCR   CBC/DIFF    Narrative:  The following orders were created for panel order CBC/DIFF.  Procedure                               Abnormality         Status                     ---------                               -----------         ------                     CBC WITH ZOXW[[id number]]                Abnormal            Final result                 Please view results for these tests on the individual orders.   URINALYSIS, MACROSCOPIC AND MICROSCOPIC W/CULTURE REFLEX    Narrative:     The following orders were created for panel order URINALYSIS, MACROSCOPIC AND MICROSCOPIC W/CULTURE REFLEX.  Procedure                               Abnormality         Status                     ---------                               -----------         ------                     URINALYSIS, MACROSCOPIC[[id number]]      Abnormal            Final result               URINALYSIS, MICROSCOPIC[[id number]]      Abnormal            Final result                 Please view results for these tests on the individual orders.     XR AP MOBILE CHEST   Final Result by Edi, Radresults In (03/20 1204)   NO ACUTE FINDINGS.            Radiologist location ID: [id number]           Medical Decision Making       Medical Decision Making  Patient is a 73 year old male to the emergency department complaining of diarrhea, fever, weakness.  Patient states it has been ongoing for the past 2 days but worsening today.  Patient states stays unable to get out of bed.  Patient is a additionally complaining of left lower quadrant pain with movement.  Patient denies past medical history of diverticulitis.  Patient rates pain a 5/10 currently.  Patient denies taking medicine prior to arrival for fever.  Patient states sick contacts include family that had norovirus recently.   Patient denies any recent antibiotic use or recent travel outside of the country    Differential diagnosis include but are not limited to  diverticulitis, abdominal abscess, gastroenteritis, colitis.  On physical examination patient abdomen is soft but tender to right and left lower quadrants with palpation.  Left worse than right.  Patient is febrile in emergency department and tachycardic.  Patient is alert and oriented to person place time and situation.    Septic workup initiated due to fever, tachycardia, diarrhea.  Patient has leukocytosis with white count greater than 15.  No further pertinent lab findings noted.  Patient given septic bolus, Zosyn , Tylenol .  Patient is transferred to [facility] ED for CT abdomen and pelvis out further etiology.    Amount and/or Complexity of Data Reviewed  Labs: ordered.  Radiology: ordered.    Risk  OTC drugs.  Prescription drug management.  Decision regarding hospitalization.                Medications Ordered/Administered in the ED   NS flush syringe (has no administration in time range)   NS flush syringe (has no administration in time range)   NS flush syringe (3 mL Intracatheter Given 11/14/23 1400)   NS flush syringe (has no administration in time range)   NS bolus infusion 1,000 mL (1,000 mL Intravenous New Bag/New Syringe 11/14/23 1315)   NS bolus infusion 1,000 mL (0 mL Intravenous Stopped 11/14/23 1230)   acetaminophen  (TYLENOL ) tablet (650 mg Oral Given 11/14/23 1223)   magnesium  sulfate 1 G in D5W 100 mL premix IVPB (0 g Intravenous Stopped 11/14/23 1337)   magnesium  sulfate 1 G in D5W 100 mL premix IVPB (0 g Intravenous Stopped 11/14/23 1256)   piperacillin -tazobactam (ZOSYN ) 4.5 g in NS 100 mL IVPB minibag (4.5 g Intravenous New Bag/New Syringe 11/14/23 1245)         Disposition: Transfered to Another Facility

## 2023-11-14 NOTE — ED Nurses Note (Signed)
 Patent is alert and is able to voice his wants and needs. Patient did advise this nurse that he drinks 3-4 light beers a day and states, "It is about time for my first beer." I advised him that he would be unable to drink under our care. Provider made aware of comments.

## 2023-11-14 NOTE — H&P (Signed)
 Evan Bell    HOSPITALIST H&P    Evan Bell 73 y.o. male 432/A   Date of Service: 11/14/2023    Date of Admission:  11/14/2023   PCP: Pcp Not In System Code Status:FULL CODE: ATTEMPT RESUSCITATION/CPR       Chief Complaint:  " Nausea, diarrhea, fever, weakness, diaphoresis "    HPI:   Evan Bell is a 73 year old male who presented to the emergency room on 11/14/2023 with complaints of nausea, diarrhea, fever, chills, diaphoresis, body aches, decreased oral intake for the last couple of days.  He stated that he was so weak yesterday that his legs gave out and he fell.  No injury after fall.  Patient denies any chest pain, shortness for breath, vomiting.  Labs upon arrival to the ER were as follows: Magnesium  1.4, WBC 15.4, hemoglobin 11.8, platelet 134, sodium 128, potassium 3.7, BUN 11, creatinine 0.92, glucose 112, total bilirubin 1.2.  Urinalysis showing trace ketones, moderate blood, trace protein.  Blood cultures x2 pending.  Chest x-ray negative.  Respiratory 4 Plex negative.  CT of the abdomen/pelvis showing inflammatory changes and fluid in the right upper quadrant, possible gastritis.  The patient was seen examined for hospitalist admission while in ER.  Patient was feeling better at that time and attempting to eat a sandwich.        ED medications:   Medications Ordered/Administered in the ED   NS flush syringe (3 mL Intracatheter Given 11/14/23 1400)   NS flush syringe (has no administration in time range)   NS bolus infusion 1,000 mL (0 mL Intravenous Stopped 11/14/23 1230)   acetaminophen  (TYLENOL ) tablet (650 mg Oral Given 11/14/23 1223)   magnesium  sulfate 1 G in D5W 100 mL premix IVPB (0 g Intravenous Stopped 11/14/23 1337)   magnesium  sulfate 1 G in D5W 100 mL premix IVPB (0 g Intravenous Stopped 11/14/23 1256)   NS bolus infusion 1,000 mL (0 mL Intravenous Stopped 11/14/23 1415)   piperacillin -tazobactam (ZOSYN ) 4.5 g in NS 100 mL IVPB minibag (0 g Intravenous  Stopped 11/14/23 1400)   NS bolus infusion 800 mL (0 mL Intravenous Stopped 11/14/23 1529)   iohexol  (OMNIPAQUE  350) infusion (75 mL Intravenous Given 11/14/23 1836)         PMHx:    Past Medical History:   Diagnosis Date    Agent orange exposure     Diabetes mellitus, type 2 (CMS HCC)     Esophageal reflux     Essential hypertension     Hypercholesterolemia         PSHx:   Past Surgical History:   Procedure Laterality Date    HX CHOLECYSTECTOMY            Allergies:    Allergies   Allergen Reactions    Buspirone  Other Adverse Reaction (Add comment)    Prazosin Mental Status Effect    Sertraline      Other reaction(s): Numbness    Social History  Social History     Tobacco Use    Smoking status: Never    Smokeless tobacco: Current   Substance Use Topics    Alcohol use: Yes     Alcohol/week: 14.0 standard drinks of alcohol     Types: 14 Cans of beer per week     Comment: States drinks couple of beers every day    Drug use: Never       Family History  Family Medical History:  Problem Relation (Age of Onset)    Asthma Mother    Colon Cancer Father    Esophageal cancer Father    Kidney failure Father               Home Meds:      Prior to Admission medications    Medication Sig Start Date End Date Taking? Authorizing Provider   aspirin  (ECOTRIN) 81 mg Oral Tablet, Delayed Release (E.C.) Take 1 Tablet (81 mg total) by mouth Daily   Yes Provider, Historical   cholecalciferol, vitamin D3, 25 mcg (1,000 unit) Oral Tablet 75 mcg 12/06/21  Yes Provider, Historical   cyanocobalamin (VITAMIN B-12) 500 mcg Oral Tablet 1 Tablet (500 mcg total) 04/11/21  Yes Provider, Historical   famotidine  (PEPCID ) 40 mg Oral Tablet Take 1 Tablet (40 mg total) by mouth Daily 05/30/21  Yes Provider, Historical   fluticasone  propionate (FLONASE ) 50 mcg/actuation Nasal Spray, Suspension Administer 2 Sprays into each nostril Twice daily 01/22/18  Yes Provider, Historical   HYDROcodone -acetaminophen  (NORCO) 7.5-325 mg Oral Tablet Take 1 Tablet by  mouth Every 6 hours as needed for Pain 06/06/23   Harmon, Kristin, PA-C   MetFORMIN  (GLUCOPHAGE ) 1,000 mg Oral Tablet Take 2 Tablets (2,000 mg total) by mouth Twice daily with food   Yes Provider, Historical   Metoprolol  Succinate (TOPROL -XL) 200 mg Oral Tablet Sustained Release 24 hr Take 0.5 Tablets (100 mg total) by mouth Twice daily 12/23/15  Yes Provider, Historical   Milk Thistle 175 mg Oral Tablet Take 1 Tablet (175 mg total) by mouth Daily   Yes Provider, Historical   pantoprazole  (PROTONIX ) 40 mg Oral Tablet, Delayed Release (E.C.) Take 1 Tablet (40 mg total) by mouth Twice daily   Yes Provider, Historical   Vardenafil (LEVITRA) 20 mg Oral Tablet 1 Tablet (20 mg total) 02/09/10  Yes Provider, Historical   amLODIPine (NORVASC) 10 mg Oral Tablet Take 1 Tablet (10 mg total) by mouth Once a day 02/09/10 11/14/23  Provider, Historical   atorvastatin (LIPITOR) 80 mg Oral Tablet Take 1 Tablet (80 mg total) by mouth Once a day 12/03/13 11/14/23  Provider, Historical   DIOVAN HCT 320-12.5 mg Oral Tablet  01/17/22 11/14/23  Provider, Historical   Fluocinolone  Acetonide Oil (DERMOTIC  OIL) 0.01 % Otic Drops 4 gtts AU QHS PRN 05/18/22 11/14/23  Weitzel, Lavonia Powers, DO   gabapentin (NEURONTIN) 400 mg Oral Capsule 1 Capsule (400 mg total) 11/20/21 11/14/23  Provider, Historical   MetFORMIN  (GLUCOPHAGE ) 1,000 mg Oral Tablet Take 1 Tablet (1,000 mg total) by mouth Twice daily Indications: hold two days post left heart catheterization 01/29/22 11/14/23  Mack Sayer, APRN,FNP-BC   montelukast (SINGULAIR) 10 mg Oral Tablet 1 Tablet (10 mg total) 07/18/21 11/14/23  Provider, Historical   ranolazine  (RANEXA ) 500 mg Oral Tablet Sustained Release 12 hr Take 1 Tablet (500 mg total) by mouth Twice daily 01/29/22 11/14/23  Ward, Mara Seminole, MD   Sildenafil (VIAGRA) 100 mg Oral Tablet 1 Tablet (100 mg total) 12/03/13 11/14/23  Provider, Historical          ROS:   Review of systems completed and was negative except for what was mentioned in HPI.      Results  for orders placed or performed during the hospital encounter of 11/14/23 (from the past 24 hours)   BASIC METABOLIC PANEL   Result Value Ref Range    SODIUM 128 (L) 136 - 145 mmol/L    POTASSIUM 3.7 3.5 - 5.1 mmol/L    CHLORIDE  93 (L) 98 - 107 mmol/L    CO2 TOTAL 26 21 - 32 mmol/L    ANION GAP 9 4 - 13 mmol/L    CALCIUM 8.8 8.5 - 10.1 mg/dL    GLUCOSE 409 (H) 74 - 106 mg/dL    BUN 11 7 - 18 mg/dL    CREATININE 8.11 9.14 - 1.30 mg/dL    BUN/CREA RATIO 12     ESTIMATED GFR 87 >59 mL/min/1.67m^2    OSMOLALITY, CALCULATED 257 (L) 270 - 290 mOsm/kg   HEPATIC FUNCTION PANEL   Result Value Ref Range    ALBUMIN 2.9 (L) 3.4 - 5.0 g/dL    ALKALINE PHOSPHATASE 68 46 - 116 U/L    ALT (SGPT) 21 <=78 U/L    AST (SGOT) 24 15 - 37 U/L    BILIRUBIN TOTAL 1.2 (H) 0.2 - 1.0 mg/dL    BILIRUBIN DIRECT 0.4 (H) 0.0 - 0.2 mg/dL    BILIRUBIN, INDIRECT 0.8 mg/dL    PROTEIN TOTAL 6.6 6.4 - 8.2 g/dL    GLOBULIN 3.7     ALBUMIN/GLOBULIN RATIO 0.8 0.8 - 1.4   LACTIC ACID LEVEL W/ REFLEX FOR LEVEL >2.0   Result Value Ref Range    LACTIC ACID 1.3 0.4 - 2.0 mmol/L   TROPONIN-I   Result Value Ref Range    TROPONIN I 18 <20 ng/L   CBC WITH DIFF   Result Value Ref Range    WBC 15.4 (H) 3.6 - 10.2 x10^3/uL    RBC 3.90 (L) 4.06 - 5.63 x10^6/uL    HGB 11.8 (L) 12.5 - 16.3 g/dL    HCT 78.2 (L) 95.6 - 47.1 %    MCV 87.9 73.0 - 96.2 fL    MCH 30.3 23.8 - 33.4 pg    MCHC 34.5 32.5 - 36.3 g/dL    RDW 21.3 (H) 08.6 - 16.2 %    PLATELETS 134 (L) 140 - 440 x10^3/uL    MPV 8.0 7.4 - 11.4 fL    NEUTROPHIL % 89 (H) 44 - 74 %    LYMPHOCYTE % 4 (L) 15 - 43 %    MONOCYTE % 6 6 - 14 %    EOSINOPHIL % 0 (L) 1 - 8 %    BASOPHIL % 0 0 - 1 %    NEUTROPHIL # 13.67 (H) 1.70 - 7.60 x10^3/uL    LYMPHOCYTE # 0.64 (L) 1.00 - 3.20 x10^3/uL    MONOCYTE # 0.96 0.30 - 1.10 x10^3/uL    EOSINOPHIL # 0.07 0.00 - 0.50 x10^3/uL    BASOPHIL # 0.04 0.00 - 0.10 x10^3/uL   MAGNESIUM    Result Value Ref Range    MAGNESIUM  1.4 (L) 1.8 - 2.4 mg/dL   URINALYSIS, MACROSCOPIC   Result Value Ref  Range    COLOR Yellow Light Yellow, Yellow    APPEARANCE Clear Clear    SPECIFIC GRAVITY 1.010 1.003 - 1.035    PH 7.0 4.6 - 8.0    LEUKOCYTES Negative Negative WBCs/uL    NITRITE Negative Negative    PROTEIN Trace (A) Negative mg/dL    GLUCOSE Negative Negative mg/dL    KETONES Trace (A) Negative mg/dL    BILIRUBIN Negative Negative mg/dL    BLOOD Moderate (A) Negative mg/dL    UROBILINOGEN 2.0 0.2 - 1.0 mg/dL   URINALYSIS, MICROSCOPIC   Result Value Ref Range    RBCS 0-3 0-3, Not Present /hpf    BACTERIA Rare (A) Negative /hpf    MUCOUS Rare  Rare, Occasional, Few /hpf    WBCS Not Present Not Present, Occasional, 0-5 /hpf   COVID-19, FLU A/B, RSV RAPID BY PCR   Result Value Ref Range    SARS-CoV-2 Not Detected Not Detected    INFLUENZA VIRUS TYPE A Not Detected Not Detected    INFLUENZA VIRUS TYPE B Not Detected Not Detected    RESPIRATORY SYNCTIAL VIRUS (RSV) Not Detected Not Detected   ETHANOL, SERUM   Result Value Ref Range    ETHANOL <10 0 mg/dL   BLUE TOP TUBE   Result Value Ref Range    RAINBOW/EXTRA TUBE AUTO RESULT Yes    GRAY TOP TUBE   Result Value Ref Range    RAINBOW/EXTRA TUBE AUTO RESULT Yes    POC BLOOD GLUCOSE (RESULTS)   Result Value Ref Range    GLUCOSE, POC 172 (H) 70 - 100 mg/dl          Physical:  Filed Vitals:    11/14/23 1900 11/14/23 1915 11/14/23 2252 11/14/23 2305   BP: (!) 189/87 (!) 188/88 (!) 185/78    Pulse:   (!) 101 92   Resp:   20    Temp:   37.3 C (99.1 F)    SpO2: (!) 85% 92% 93%       General: Patient is alert and oriented to person, place, and time. No acute distress. Communicates appropriately.  Sitting up on side of bed..    Throat: Moist oral mucosa. No erythema or exudate of the pharynx. Clear oropharynx.    Neck: Supple. No cervical lymphadenopathy or supraclavicular nodes detected. Trachea midline   Heart: Regular rate and rhythm. S1 & S2 present. No S3 or S4. No rubs, gallops, or murmurs appreciated.  Radial and dorsalis pedis pulses +2/4 bilaterally.  Brisk capillary  refill.    Lungs: Clear to auscultation bilaterally with no wheezes or rales. Equal chest excursion.  No conversational dyspnea. No respiratory distress noted.   Abdomen: Soft, nontender, nondistended belly. Bowel sounds are present and hyperactive in all four quadrants.   Extremities: No edema, cyanosis, or clubbing. Grossly moves all extremities.    Skin: Warm and dry without lesions. No ecchymosis noted.    Neurologic: Cranial nerves II through XII are grossly intact. Sensation to light touch is intact. Strength 5/5 in upper extremities and lower extremities bilaterally.    Genitourinary:  No urinary incontinence or Foley catheter   Psychiatric: Judgment and insight are intact. Mood and affect are appropriate for the situation.         Assessments:  Active Hospital Problems   (*Primary Problem)    Diagnosis    *Gastroenteritis    Hyponatremia    Hypomagnesemia    Nausea    Diarrhea    Generalized weakness    Fever     Gastroenteritis--suspected        Nausea        Diarrhea   Imaging showing inflammatory changes in right upper quadrant, possible gastritis/enteritis.  Patient will be continued on Zosyn  IV.  Stool for CDT and GI BioFire ordered.  Patient ordered Zofran  p.r.n. for nausea.  Patient reports improvement in nausea since arrival to the ER.    2.  Hyponatremia   Sodium low at 128.  Felt to be due to dehydration and decreased oral intake.  Patient is started on IV fluids of normal saline at 100 mL/hour.  Labs ordered for a.m..    3.  Hypomagnesemia   Magnesium  low at 1.4.  Electrolytes replaced as appropriate.  Labs ordered for a.m..    4.  Generalized weakness   Felt to be due to underlying illness.  Physical therapy evaluation    5.  Fever   Temperature was 102.1 upon arrival to the ER.  Patient order Tylenol  p.r.n..    6.  Type 2 diabetes   Sliding scale insulin  coverage ordered for control of blood sugar.  Hemoglobin A1c ordered for a.m.Aaron Aas    Plan:  Patient will be admitted for the above problems.  The  patient will be monitored on telemetry.  Home medications will be restarted as appropriate.  Labs ordered for a.m.Aaron Aas  Further orders will depend upon clinical course. The Hospitalist personally evaluated and examined the patient in conjunction with the MLP and agree with the assessments, treatment plan and disposition of the patient as recorded by the [facility].      Code status: FULL CODE: ATTEMPT RESUSCITATION/CPR  DVT prophylaxis:  Heparin     Diet: DIET BLAND Do you want to initiate MNT Protocol? Yes; Calorie amount: CC 2000    Disposition:  The patient is currently acutely ill requiring treatment on the medical floor for inpatient admission. Patient will be closely evaluated monitor and remove be adjusted accordingly.  Estimated length of stay greater than 48 hr to obtain full medical treatment.      Danie Duran, FNP-BC    [facility] MEDICINE HOSPITALIST

## 2023-11-14 NOTE — ED APP Handoff Note (Signed)
 [facility] - Emergency Department  Emergency Department  Handoff Note    Care/report received from [facility] ER @ 1800  Per report:  Evan Bell is a 73 y.o. male who had concerns including Diarrhea.     Pending labs/imaging/consults:  CT abdomen pelvis  Plan:  Labs demonstrate dehydration hyponatremia hypomagnesemia.  Patient is an alcoholic.  CT scan demonstrates some fluid near the duodenum they believe it is likely related to duodenitis.  Also some pneumobilia likely related to previous cholecystectomy.  Abdomen has been benign.  Patient states he drinks 3-4 light beers a day.  Discussed case with hospital team who agreed evaluate patient for admission    Course:      Patient will be admitted to the  service for further workup and management.    Disposition: Admitted    Clinical Impression   Diarrhea, unspecified type (Primary)   Sepsis, due to unspecified organism, unspecified whether acute organ dysfunction present (CMS HCC)         Vila Grayer, PA-C

## 2023-11-15 DIAGNOSIS — Z5329 Procedure and treatment not carried out because of patient's decision for other reasons: Secondary | ICD-10-CM

## 2023-11-15 LAB — COMPREHENSIVE METABOLIC PANEL, NON-FASTING
ALBUMIN/GLOBULIN RATIO: 1.2 (ref 0.8–1.4)
ALBUMIN: 3.3 g/dL — ABNORMAL LOW (ref 3.5–5.7)
ALKALINE PHOSPHATASE: 42 U/L (ref 34–104)
ALT (SGPT): 12 U/L (ref 7–52)
ANION GAP: 6 mmol/L (ref 4–13)
AST (SGOT): 20 U/L (ref 13–39)
BILIRUBIN TOTAL: 0.9 mg/dL (ref 0.3–1.0)
BUN/CREA RATIO: 10 (ref 6–22)
BUN: 8 mg/dL (ref 7–25)
CALCIUM, CORRECTED: 8.9 mg/dL (ref 8.9–10.8)
CALCIUM: 8.3 mg/dL — ABNORMAL LOW (ref 8.6–10.3)
CHLORIDE: 99 mmol/L (ref 98–107)
CO2 TOTAL: 25 mmol/L (ref 21–31)
CREATININE: 0.79 mg/dL (ref 0.60–1.30)
ESTIMATED GFR: 93 mL/min/{1.73_m2} (ref >59)
GLOBULIN: 2.8 (ref 2.0–3.5)
GLUCOSE: 111 mg/dL — ABNORMAL HIGH (ref 74–109)
OSMOLALITY, CALCULATED: 260 mosm/kg — ABNORMAL LOW (ref 270–290)
POTASSIUM: 3.6 mmol/L (ref 3.5–5.1)
PROTEIN TOTAL: 6.1 g/dL — ABNORMAL LOW (ref 6.4–8.9)
SODIUM: 130 mmol/L — ABNORMAL LOW (ref 136–145)

## 2023-11-15 LAB — IRON TRANSFERRIN AND TIBC
IRON: <10 ug/dL — ABNORMAL LOW (ref 50–212)
TOTAL IRON BINDING CAPACITY: 263 ug/dL (ref 250–450)
TRANSFERRIN: 188 mg/dL — ABNORMAL LOW (ref 203–362)

## 2023-11-15 LAB — CBC WITH DIFF
BASOPHIL #: 0 10*3/uL (ref 0.00–0.10)
BASOPHIL %: 0 % (ref 0–1)
EOSINOPHIL #: 0.1 10*3/uL (ref 0.00–0.50)
EOSINOPHIL %: 1 % (ref 1–8)
HCT: 32.3 % — ABNORMAL LOW (ref 36.7–47.1)
HGB: 11.1 g/dL — ABNORMAL LOW (ref 12.5–16.3)
LYMPHOCYTE #: 0.7 10*3/uL — ABNORMAL LOW (ref 1.00–3.20)
LYMPHOCYTE %: 7 % — ABNORMAL LOW (ref 15–43)
MCH: 29.8 pg (ref 23.8–33.4)
MCHC: 34.4 g/dL (ref 32.5–36.3)
MCV: 86.6 fL (ref 73.0–96.2)
MONOCYTE #: 1.1 10*3/uL (ref 0.30–1.10)
MONOCYTE %: 10 % (ref 6–14)
MPV: 7.3 fL — ABNORMAL LOW (ref 7.4–11.4)
NEUTROPHIL #: 8.6 10*3/uL — ABNORMAL HIGH (ref 1.70–7.60)
NEUTROPHIL %: 82 % — ABNORMAL HIGH (ref 44–74)
PLATELETS: 144 10*3/uL (ref 140–440)
RBC: 3.73 10*6/uL — ABNORMAL LOW (ref 4.06–5.63)
RDW: 17.4 % — ABNORMAL HIGH (ref 12.1–16.2)
WBC: 10.5 10*3/uL — ABNORMAL HIGH (ref 3.6–10.2)

## 2023-11-15 LAB — LAVENDER TOP TUBE

## 2023-11-15 LAB — MAGNESIUM: MAGNESIUM: 1.6 mg/dL — ABNORMAL LOW (ref 1.9–2.7)

## 2023-11-15 LAB — GOLD TOP TUBE

## 2023-11-15 LAB — HGA1C (HEMOGLOBIN A1C WITH EST AVG GLUCOSE): HEMOGLOBIN A1C: 5.7 % (ref 4.0–6.0)

## 2023-11-15 LAB — LIPASE: LIPASE: 83 U/L — ABNORMAL HIGH (ref 11–82)

## 2023-11-15 LAB — POC BLOOD GLUCOSE (RESULTS)
GLUCOSE, POC: 124 mg/dL — ABNORMAL HIGH (ref 70–100)
GLUCOSE, POC: 155 mg/dL — ABNORMAL HIGH (ref 70–100)

## 2023-11-15 MED ORDER — METOPROLOL SUCCINATE ER 50 MG TABLET,EXTENDED RELEASE 24 HR
100.0000 mg | ORAL_TABLET | Freq: Two times a day (BID) | ORAL | Status: DC
Start: 2023-11-15 — End: 2023-11-15
  Administered 2023-11-15: 100 mg via ORAL
  Filled 2023-11-15: qty 2

## 2023-11-15 MED ORDER — HYDROCODONE 7.5 MG-ACETAMINOPHEN 325 MG TABLET
1.0000 | ORAL_TABLET | Freq: Four times a day (QID) | ORAL | Status: DC | PRN
Start: 2023-11-15 — End: 2023-11-15

## 2023-11-15 MED ORDER — FLUTICASONE PROPIONATE 50 MCG/ACTUATION NASAL SPRAY,SUSPENSION
2.0000 | Freq: Two times a day (BID) | NASAL | Status: DC
Start: 2023-11-15 — End: 2023-11-15
  Administered 2023-11-15: 2 via NASAL
  Filled 2023-11-15: qty 16

## 2023-11-15 MED ORDER — MAGNESIUM SULFATE 1 GRAM/100 ML IN DEXTROSE 5 % INTRAVENOUS PIGGYBACK
1.0000 g | INJECTION | INTRAVENOUS | Status: AC
Start: 2023-11-15 — End: 2023-11-15
  Administered 2023-11-15: 0 g via INTRAVENOUS
  Administered 2023-11-15: 1 g via INTRAVENOUS
  Administered 2023-11-15 (×2): 0 g via INTRAVENOUS
  Administered 2023-11-15 (×2): 1 g via INTRAVENOUS
  Filled 2023-11-15 (×3): qty 100

## 2023-11-15 MED ORDER — PANTOPRAZOLE 40 MG INTRAVENOUS SOLUTION
40.0000 mg | Freq: Two times a day (BID) | INTRAVENOUS | Status: DC
Start: 2023-11-15 — End: 2023-11-15
  Administered 2023-11-15: 40 mg via INTRAVENOUS
  Filled 2023-11-15: qty 10

## 2023-11-15 MED ORDER — FAMOTIDINE 40 MG TABLET
40.0000 mg | ORAL_TABLET | Freq: Every day | ORAL | Status: DC
Start: 2023-11-15 — End: 2023-11-15
  Administered 2023-11-15: 40 mg via ORAL
  Filled 2023-11-15: qty 1

## 2023-11-15 NOTE — PT Evaluation (Signed)
 [facility]  [address]  [place], [address]  [phone]  (Fax) [phone]  Rehabilitation Services  Physical Therapy Inpatient Initial Evaluation    Patient Name: Evan Bell  Date of Birth: 1951-08-17  Height: Height: 177.8 cm (5\' 10" )  Weight: Weight: 96.4 kg (212 lb 7 oz)  Room/Bed: 432/A  Payor: MEDICARE / Plan: MEDICARE PART A AND B / Product Type: Medicare /       PMH:  Past Medical History:   Diagnosis Date    Agent orange exposure     Diabetes mellitus, type 2 (CMS HCC)     Esophageal reflux     Essential hypertension     Hypercholesterolemia            Assessment:      (P) THE PT EXHIBITED NO STRENGTH OR BALANCE DEFICITS WITH MOBILITY . HE STATES HE IS AT HIS BASELINE LEVEL OF MOBILITY AND HAD NO ISSUES PRIOR TO ADMISSION. HE DOES NOT NEED REHAB SERVICES AT THIS TIME. PT HOPES FOR DISCHARGE HOME TODAY.    Total Distance Ambulated: (P) 200  Independence: (P) independent, other (see comments) (ASSISTANCE FOR IV POLE)  Assistive Device: (P) other (see comments) (NONE)      Discharge Needs:    Equipment Recommendation: (P) other (see comments) (NONE)      The patient presents with NO  mobility limitations that significantly impair/prevent patient's ability to participate in mobility-related activities of daily living (MRADLs) including  ambulation and transfers in order to safely complete. This functional mobility deficit can be sufficiently resolved with the use of a (P) other (see comments) (NONE)  in order to decrease the risk of falls, morbidity, and mortality in performance of these MRADLs.  Patient is able to safely use this assistive device.    Discharge Disposition: (P) home    JUSTIFICATION OF DISCHARGE RECOMMENDATION   Based on current diagnosis, functional performance prior to admission, and current functional performance, this patient  does not require continued PT services in (P) home in order to achieve significant functional improvements in  these deficit areas:  .        Plan:   Current Intervention: (P) other (see comments) (NONE)  To provide physical therapy services    for duration of  .    The risks/benefits of therapy have been discussed with the patient/caregiver and he/she is in agreement with the established plan of care.       Subjective & Objective     Past Medical History:   Diagnosis Date    Agent orange exposure     Diabetes mellitus, type 2 (CMS HCC)     Esophageal reflux     Essential hypertension     Hypercholesterolemia             Past Surgical History:   Procedure Laterality Date    HX CHOLECYSTECTOMY                   11/15/23 0900   Rehab Session   Document Type evaluation   PT Visit Date 11/15/23   Total PT Minutes: 14   Patient Effort good   Symptoms Noted During/After Treatment none   General Information   Patient Profile Reviewed yes   Onset of Illness/Injury or Date of Surgery 11/14/23   General Observations of Patient in NAD, awaiting endoscopy procedure   Medical Lines PIV Line;Telemetry   Existing Precautions/Restrictions no known precautions/limitations   Mutuality/Individual Preferences  Anxieties, Fears or Concerns needs to get home to care for his wif   Living Environment   Lives With spouse   Living Arrangements house   Home Accessibility no concerns   Functional Level Prior   Ambulation 0 - independent   Transferring 0 - independent   Toileting 0 - independent   Bathing 0 - independent   Dressing 0 - independent   Eating 0 - independent   Communication 0 - understands/communicates without difficulty   Self-Care   Equipment Currently Used at Home no   Pre Treatment Status   Pre Treatment Patient Status Patient supine in bed;Nurse approved session;Patient safety alarm activated   Communication Pre Treatment  Charge Nurse   Cognition   Behavior/Mood Observations behavior appropriate to situation, WNL/WFL   Orientation Status oriented x 4   Attention WNL/WFL   Vital Signs   Pre-Treatment Heart Rate (beats/min) 93    Post-treatment Heart Rate (beats/min) 96   Pre SpO2 (%) 93   O2 Delivery Pre Treatment room air   Post SpO2 (%) 90   O2 Delivery Post Treatment room air   Pain Assessment   Pre/Posttreatment Pain Comment pre exisiting LBP, pt was going to OPT  prior to DOA   General Extremity Assessment   Comment STRENGHT /ROM [facility] X 4   Trunk Assessment   Trunk Assessment WFL-Within Functional Limits   Bed Mobility   Supine-Sit Independence independent   Sit to Supine, Independence independent   Bed Mobility, Assistive Device bed rails   Safety Issues   (NONE)   Transfer Assessment/Treatment   Sit-Stand Independence independent   Stand-Sit Independence independent   Sit-Stand-Sit, Assist Device None   Gait Assessment/Treatment   Total Distance Ambulated 200   Independence  independent;other (see comments)  (ASSISTANCE FOR IV POLE)   Assistive Device  other (see comments)  (NONE)   Distance in Feet 200   Gait Speed WNL   Deviations    (NONE)   Maintain Weight Bearing Status other (see comments)  (NA)   Safety Issues  other (see comments)  (NONE)   Balance   Comment WNL   Therapeutic Exercise/Activity   Comment AMBULATION IN HALLWAYS   Post Treatment Status   Post Treatment Patient Status Patient supine in bed;Call light within reach;Patient safety alarm activated   Physical Therapy Clinical Impression   Assessment THE PT EXHIBITED NO STRENGTH OR BALANCE DEFICITS WITH MOBILITY . HE STATES HE IS AT HIS BASELINE LEVEL OF MOBILITY AND HAD NO ISSUES PRIOR TO ADMISSION. HE DOES NOT NEED REHAB SERVICES AT THIS TIME. PT HOPES FOR DISCHARGE HOME TODAY.   Criteria for Skilled Therapeutic no   Pathology/Pathophysiology Noted other (see comments)  (NONE)   Anticipated Equipment Needs at Discharge (PT) other (see comments)  (NONE)   Anticipated Discharge Disposition home   Evaluation Complexity Justification   Patient History: Co-morbidity/factors that impact Plan of Care 0 none that impact Plan of Care   Presentation Stable: Uncomplicated,  straight-forward, problem focused   Clinical Decision Making Low complexity   Planned Therapy Interventions, PT Eval   Planned Therapy Interventions (PT) other (see comments)  (NONE)   (INSERT FLOWSHEET)            INTERVENTION MINUTES: EVALUATION 14 minutes    EVALUATION COMPLEXITY : CLINICAL DECISION MAKING OF LOW COMPLEXITY AS INDICATED BY PMH, PHYSICAL THERAPY ASSESSMENT OF MUSCULOSKELETAL AND NEUROLOGICAL SYSTEMS AND ACTIVITY LIMITATIONS. CLINICAL PRESENTATION IS STABLE AND UNCOMPLICATED    Therapist:  Lars Poche, PT  11/15/2023, 10:05

## 2023-11-15 NOTE — ED Notes (Signed)
 [facility] - Emergency Department  Peer Recovery Coach Assessment    Initial Evaluation  Referred by:: Nurse  Location of Evaluation: Inpatient/Obs Unit  How many times in the last 12 months have you been to the ED?: 2  Have you ever served or are you currently serving in the Armed Forces?: Yes  In which branch of the [employer]?: [employer]  Did you see combat?: Yes  Do you receive care from the [place]?: Yes    Substance Use History  Patient current substance use status: Admission alert to room 432 A:  Patient states that he may drinks a few beers everyday or so and has no issue with it.  He declines treatment, I discussed harm reduction and safe practices with pt.    Prior treatment history?: No    Currently enrolled in substance use program?: No    Within the last 30 days, what substances has the patient used?: Alcohol  Patient's age at first substance use?: 21-24  Drug route of administration: Oral                   Family, Social, Home & Safety History  Marital Status: Married            Need to improve relationships with family?: No    Social network: Immediate family, Non-substance using peers/friends/other    Current living situation: Independent  Any help needed with the following?: None  Contact phone number for the patient: [phone]  Emergency contact name and phone number: alante, tolan (Wife)  [phone]    Has the patient had any legal issues within the past 30 days?: None         Employment  Current employment status: Other    Needs vocational training?: No  Needs assistance with job search?: No    Engagement  Readiness ruler: 1    Brief Intervention  Discussed plan to reduce/quit substance use?: Yes  Discussed willingness to enter treatment?: No  Indicated patient's stage of change:: 1 - Precontemplation    Patient seen by Peer Recovery Coach and is a candidate for buprenorphine administration in the ED. Patient needs assessment for bup treatment.: No    Plan  Was the patient referred to  treatment?: No    Was patient referred to physician for Buprenorphine Assessment in the ED?: No    Did patient receive Narcan in the ED?: No         Follow-up           Need for additional follow-up?: No       Bess Broody, Peer Recovery Coach 11/15/2023 08:36

## 2023-11-15 NOTE — Care Plan (Signed)
 Problem: Health Knowledge, Opportunity to Enhance (Adult,Obstetrics,Pediatric)  Goal: Knowledgeable about Health Subject/Topic  Description: Patient will demonstrate the desired outcomes by discharge/transition of care.  Outcome: Ongoing (see interventions/notes)     Problem: Adult Inpatient Plan of Care  Goal: Plan of Care Review  Outcome: Ongoing (see interventions/notes)  Goal: Patient-Specific Goal (Individualized)  Outcome: Ongoing (see interventions/notes)  Goal: Absence of Hospital-Acquired Illness or Injury  Outcome: Ongoing (see interventions/notes)  Intervention: Identify and Manage Fall Risk  Recent Flowsheet Documentation  Taken 11/14/2023 2305 by Jackqueline Mason, RN  Safety Promotion/Fall Prevention:   activity supervised   fall prevention program maintained   motion sensor pad activated   nonskid shoes/slippers when out of bed   safety round/check completed  Intervention: Prevent Infection  Recent Flowsheet Documentation  Taken 11/14/2023 2305 by Jackqueline Mason, RN  Infection Prevention:   promote handwashing   personal protective equipment utilized   barrier precautions utilized  Goal: Optimal Comfort and Wellbeing  Outcome: Ongoing (see interventions/notes)  Intervention: Provide Person-Centered Care  Recent Flowsheet Documentation  Taken 11/14/2023 2305 by Jackqueline Mason, RN  Trust Relationship/Rapport:   care explained   choices provided   questions answered   questions encouraged   reassurance provided  Goal: Rounds/Family Conference  Outcome: Ongoing (see interventions/notes)     Problem: Fall Injury Risk  Goal: Absence of Fall and Fall-Related Injury  Outcome: Ongoing (see interventions/notes)  Intervention: Identify and Manage Contributors  Recent Flowsheet Documentation  Taken 11/14/2023 2305 by Jackqueline Mason, RN  Medication Review/Management: medications reviewed  Intervention: Promote Injury-Free Environment  Recent Flowsheet Documentation  Taken 11/14/2023 2305 by Jackqueline Mason, RN  Safety Promotion/Fall Prevention:   activity  supervised   fall prevention program maintained   motion sensor pad activated   nonskid shoes/slippers when out of bed   safety round/check completed     Problem: Pain Acute  Goal: Optimal Pain Control and Function  Outcome: Ongoing (see interventions/notes)  Intervention: Optimize Psychosocial Wellbeing  Recent Flowsheet Documentation  Taken 11/14/2023 2305 by Jackqueline Mason, RN  Diversional Activities: television  Intervention: Prevent or Manage Pain  Recent Flowsheet Documentation  Taken 11/14/2023 2305 by Jackqueline Mason, RN  Medication Review/Management: medications reviewed     Problem: Infection  Goal: Absence of Infection Signs and Symptoms  Outcome: Ongoing (see interventions/notes)  Intervention: Prevent or Manage Infection  Recent Flowsheet Documentation  Taken 11/14/2023 2305 by Jackqueline Mason, RN  Isolation Precautions: contact II precautions maintained     Problem: Electrolyte Imbalance  Goal: Electrolyte Balance  Outcome: Ongoing (see interventions/notes)

## 2023-11-15 NOTE — Discharge Summary (Signed)
 [facility]     DISCHARGE SUMMARY      PATIENT NAME:  Evan Bell   MRN:  [id number]  DOB:  06/12/1951    INPATIENT ADMISSION DATE: 11/14/2023   DATE OF DISCHARGE:  11/15/23     ATTENDING PHYSICIAN:  No att. providers found    HOSPITAL PRESENTATION:  Please see full admission H&P for details.    As per HPI:  Evan Bell is a 73 year old male who presented to the emergency room on 11/14/2023 with complaints of nausea, diarrhea, fever, chills, diaphoresis, body aches, decreased oral intake for the last couple of days.  He stated that he was so weak yesterday that his legs gave out and he fell.  No injury after fall.  Patient denies any chest pain, shortness for breath, vomiting.  Labs upon arrival to the ER were as follows: Magnesium  1.4, WBC 15.4, hemoglobin 11.8, platelet 134, sodium 128, potassium 3.7, BUN 11, creatinine 0.92, glucose 112, total bilirubin 1.2.  Urinalysis showing trace ketones, moderate blood, trace protein.  Blood cultures x2 pending.  Chest x-ray negative.  Respiratory 4 Plex negative.  CT of the abdomen/pelvis showing inflammatory changes and fluid in the right upper quadrant, possible gastritis.  The patient was seen examined for hospitalist admission while in ER.  Patient was feeling better at that time and attempting to eat a sandwich.     FURTHER HOSPITAL COURSE:  Patient was admitted overnight with suspected gastroenteritis, nausea, diarrhea, hyponatremia, hypomagnesemia, generalized weakness, fever, and type 2 diabetes. Patient was placed on iv fluids, electrolyte replacement, accu-checks with issc, and supportive care. Stool studies were ordered, but patient's diarrhea resolved. CT showed inflammatory changes right upper quadrant, potentially related to PUD, may also be gastritis. Dr. Alyssa Backbone consulted for GI evaluation/recommendations. Possible EGD. Patient was placed on IV Protonix  and po Pepcid . Iron studies ordered. Lipase added to blood in lab.  Patient decided to leave AMA prior to GI evaluation and completion of treatment plan. Please refer to nursing notes for further details. Hospitalist aware.     PROBLEM LIST:  Active Hospital Problems    Diagnosis Date Noted    Principal Problem: Gastroenteritis [K52.9] 11/14/2023    Hyponatremia [E87.1] 11/14/2023    Hypomagnesemia [E83.42] 11/14/2023    Nausea [R11.0] 11/14/2023    Diarrhea [R19.7] 11/14/2023    Generalized weakness [R53.1] 11/14/2023    Fever [R50.9] 11/14/2023      Resolved Hospital Problems   No resolved problems to display.     Active Non-Hospital Problems    Diagnosis Date Noted    Atypical angina (CMS HCC) 01/29/2022    Primary hypertension 01/29/2022    Mixed hyperlipidemia 01/29/2022     Nutrition:    No diet orders on file    Additional clinical characteristics related to nutrition:    - monitor for weight changes   - monitor intake and output    - monitor bowel functions        PHYSICAL EXAM DAY OF DISCHARGE:  BP (!) 171/75   Pulse 71   Temp 37.2 C (98.9 F)   Resp 16   Ht 1.778 m (5\' 10" )   Wt 96.4 kg (212 lb 7 oz)   SpO2 97%   BMI 30.48 kg/m      Physical Exam  Constitutional:       General: He is not in acute distress.  Cardiovascular:      Rate and Rhythm: Normal rate and regular rhythm.  Pulmonary:      Effort: Pulmonary effort is normal. No respiratory distress.      Breath sounds: Normal breath sounds. No wheezing, rhonchi or rales.   Abdominal:      General: Bowel sounds are normal. There is no distension.      Palpations: Abdomen is soft.      Tenderness: There is no abdominal tenderness.   Musculoskeletal:         General: No swelling.   Skin:     General: Skin is warm and dry.   Neurological:      General: No focal deficit present.      Mental Status: He is alert and oriented to person, place, and time.       LABS WITHIN LAST 24 HOURS:   Results for orders placed or performed during the hospital encounter of 11/14/23 (from the past 24 hours)   ETHANOL, SERUM   Result  Value Ref Range    ETHANOL <10 0 mg/dL   BLUE TOP TUBE   Result Value Ref Range    RAINBOW/EXTRA TUBE AUTO RESULT Yes    GOLD TOP TUBE   Result Value Ref Range    RAINBOW/EXTRA TUBE AUTO RESULT Yes    LAVENDER TOP TUBE   Result Value Ref Range    RAINBOW/EXTRA TUBE AUTO RESULT Yes    GRAY TOP TUBE   Result Value Ref Range    RAINBOW/EXTRA TUBE AUTO RESULT Yes    POC BLOOD GLUCOSE (RESULTS)   Result Value Ref Range    GLUCOSE, POC 172 (H) 70 - 100 mg/dl   COMPREHENSIVE METABOLIC PANEL, NON-FASTING   Result Value Ref Range    SODIUM 130 (L) 136 - 145 mmol/L    POTASSIUM 3.6 3.5 - 5.1 mmol/L    CHLORIDE 99 98 - 107 mmol/L    CO2 TOTAL 25 21 - 31 mmol/L    ANION GAP 6 4 - 13 mmol/L    BUN 8 7 - 25 mg/dL    CREATININE 0.45 4.09 - 1.30 mg/dL    BUN/CREA RATIO 10 6 - 22    ESTIMATED GFR 93 >59 mL/min/1.31m^2    ALBUMIN 3.3 (L) 3.5 - 5.7 g/dL    CALCIUM 8.3 (L) 8.6 - 10.3 mg/dL    GLUCOSE 811 (H) 74 - 109 mg/dL    ALKALINE PHOSPHATASE 42 34 - 104 U/L    ALT (SGPT) 12 7 - 52 U/L    AST (SGOT) 20 13 - 39 U/L    BILIRUBIN TOTAL 0.9 0.3 - 1.0 mg/dL    PROTEIN TOTAL 6.1 (L) 6.4 - 8.9 g/dL    ALBUMIN/GLOBULIN RATIO 1.2 0.8 - 1.4    OSMOLALITY, CALCULATED 260 (L) 270 - 290 mOsm/kg    CALCIUM, CORRECTED 8.9 8.9 - 10.8 mg/dL    GLOBULIN 2.8 2.0 - 3.5   MAGNESIUM    Result Value Ref Range    MAGNESIUM  1.6 (L) 1.9 - 2.7 mg/dL   BJY7W (HEMOGLOBIN G9F WITH EST AVG GLUCOSE)   Result Value Ref Range    HEMOGLOBIN A1C 5.7 4.0 - 6.0 %   CBC WITH DIFF   Result Value Ref Range    WBC 10.5 (H) 3.6 - 10.2 x10^3/uL    RBC 3.73 (L) 4.06 - 5.63 x10^6/uL    HGB 11.1 (L) 12.5 - 16.3 g/dL    HCT 62.1 (L) 30.8 - 47.1 %    MCV 86.6 73.0 - 96.2 fL    MCH 29.8 23.8 - 33.4  pg    MCHC 34.4 32.5 - 36.3 g/dL    RDW 16.1 (H) 09.6 - 16.2 %    PLATELETS 144 140 - 440 x10^3/uL    MPV 7.3 (L) 7.4 - 11.4 fL    NEUTROPHIL % 82 (H) 44 - 74 %    LYMPHOCYTE % 7 (L) 15 - 43 %    MONOCYTE % 10 6 - 14 %    EOSINOPHIL % 1 1 - 8 %    BASOPHIL % 0 0 - 1 %    NEUTROPHIL #  8.60 (H) 1.70 - 7.60 x10^3/uL    LYMPHOCYTE # 0.70 (L) 1.00 - 3.20 x10^3/uL    MONOCYTE # 1.10 0.30 - 1.10 x10^3/uL    EOSINOPHIL # 0.10 0.00 - 0.50 x10^3/uL    BASOPHIL # 0.00 0.00 - 0.10 x10^3/uL   LIPASE   Result Value Ref Range    LIPASE 83 (H) 11 - 82 U/L   IRON TRANSFERRIN AND TIBC   Result Value Ref Range    TOTAL IRON BINDING CAPACITY 263 250 - 450 ug/dL    IRON (TRANSFERRIN) SATURATION      IRON <10 (L) 50 - 212 ug/dL    TRANSFERRIN 045 (L) 203 - 362 mg/dL    UIBC     POC BLOOD GLUCOSE (RESULTS)   Result Value Ref Range    GLUCOSE, POC 124 (H) 70 - 100 mg/dl   POC BLOOD GLUCOSE (RESULTS)   Result Value Ref Range    GLUCOSE, POC 155 (H) 70 - 100 mg/dl        IMAGING WITHIN LAST 24 HOURS:   XR AP MOBILE CHEST  Result Date: 11/14/2023  Impression NO ACUTE FINDINGS. Radiologist location ID: [id number]     CT ABDOMEN PELVIS W IV CONTRAST   Final Result   INFLAMMATORY CHANGE AND FLUID IN THE RIGHT UPPER QUADRANT. THIS POTENTIALLY IS RELATED TO UNDERLYING PEPTIC ULCER DISEASE. ENDOSCOPY WOULD BE REQUIRED FOR FURTHER EVALUATION. THERE MAY ALSO BE GASTRITIS.      PRIOR CHOLECYSTECTOMY WITH AIR IN THE BILIARY TREE INCLUDING THE PANCREATIC DUCT. THIS MAY BE RELATED TO PRIOR SPHINCTEROTOMY. CORRELATION WITH SURGICAL HISTORY IS REQUIRED.          One or more dose reduction techniques were used (e.g., Automated exposure control, adjustment of the mA and/or kV according to patient size, use of iterative reconstruction technique).         Radiologist location ID: [id number]         XR AP MOBILE CHEST   Final Result   NO ACUTE FINDINGS.            Radiologist location ID: [id number]              MICROBIOLOGY WITHIN LAST 24 HOURS:   No results found for any visits on 11/14/23 (from the past 24 hours).     DISCHARGE MEDICATIONS:     Current Discharge Medication List        CONTINUE these medications - NO CHANGES were made during your visit.        Details   aspirin  81 mg Tablet, Delayed Release (E.C.)  Commonly known as:  ECOTRIN   81 mg, Daily  Refills: 0     cholecalciferol (vitamin D3) 25 mcg (1,000 unit) Tablet   75 mcg  Refills: 0     cyanocobalamin 500 mcg Tablet  Commonly known as: VITAMIN B-12   500 mcg  Refills: 0     famotidine  40  mg Tablet  Commonly known as: PEPCID    1 Tablet, Daily  Refills: 0     fluticasone  propionate 50 mcg/actuation Spray, Suspension  Commonly known as: FLONASE    2 Sprays, 2 TIMES DAILY  Refills: 0     HYDROcodone -acetaminophen  7.5-325 mg Tablet  Commonly known as: NORCO   1 Tablet, Oral, EVERY 6 HOURS PRN  Qty: 12 Tablet  Refills: 0     Metoprolol  Succinate 200 mg Tablet Sustained Release 24 hr  Commonly known as: TOPROL -XL   0.5 Tablets, 2 TIMES DAILY  Refills: 0     Milk Thistle 175 mg Tablet   175 mg, Daily  Refills: 0     pantoprazole  40 mg Tablet, Delayed Release (E.C.)  Commonly known as: PROTONIX    40 mg, 2 TIMES DAILY  Refills: 0     Vardenafil 20 mg Tablet  Commonly known as: LEVITRA   20 mg  Refills: 0            ASK your doctor about these medications.        Details   MetFORMIN  1,000 mg Tablet  Commonly known as: GLUCOPHAGE   Ask about: Which instructions should I use?   2,000 mg, 2 TIMES DAILY WITH FOOD  Refills: 0             DISCHARGE DISPOSITION:  left AMA    DISCHARGE INSTRUCTIONS:  No discharge procedures on file.     Copies sent to Care Team         Relationship Specialty Notifications Start End    System, Pcp Not In PCP - General   12/28/21     Prescilla Brod          The hospitalist examined patient, reviewed material, and agreed with discharge at this time.   >30 minutes total were spent coordinating discharge day today    Antone Kiss, FNP-BC  [facility]ICINE HOSPITALIST

## 2023-11-15 NOTE — Nurses Notes (Signed)
 Patient left facility at this time Against Medical Advice. Patient stated that he did not wish to wait until tomorrow to have any procedures done and that his ride was here and he was leaving now. Patient was educated on the importance of staying and completing his medical treatment and still refused. He is A/O x4 and able to make own decisions. Patient signed his A.M.A. paperwork after it was explained to him and left the floor ambulatory dressed in street attire. Patient's IV was removed along with continuous pulse ox and telemetry at time of discharge. No s/s of bleeding noticed from IV site. No signs of distress observed. Patient stated he would call Dr. Linnell Richardson. Patel's office to have his procedure rescheduled.

## 2023-11-18 LAB — CBC WITH DIFF
BASOPHIL #: 0.04 10*3/uL (ref 0.00–0.10)
BASOPHIL %: 0 % (ref 0–1)
EOSINOPHIL #: 0.07 10*3/uL (ref 0.00–0.50)
EOSINOPHIL %: 0 % — ABNORMAL LOW (ref 1–8)
HCT: 34.3 % — ABNORMAL LOW (ref 36.7–47.1)
HGB: 11.8 g/dL — ABNORMAL LOW (ref 12.5–16.3)
LYMPHOCYTE #: 0.64 10*3/uL — ABNORMAL LOW (ref 1.00–3.20)
LYMPHOCYTE %: 4 % — ABNORMAL LOW (ref 15–43)
MCH: 30.3 pg (ref 23.8–33.4)
MCHC: 34.5 g/dL (ref 32.5–36.3)
MCV: 87.9 fL (ref 73.0–96.2)
MONOCYTE #: 0.96 10*3/uL (ref 0.30–1.10)
MONOCYTE %: 6 % (ref 6–14)
MPV: 8 fL (ref 7.4–11.4)
NEUTROPHIL #: 13.67 10*3/uL — ABNORMAL HIGH (ref 1.70–7.60)
NEUTROPHIL %: 89 % — ABNORMAL HIGH (ref 44–74)
PLATELETS: 134 10*3/uL — ABNORMAL LOW (ref 140–440)
RBC: 3.9 10*6/uL — ABNORMAL LOW (ref 4.06–5.63)
RDW: 17.6 % — ABNORMAL HIGH (ref 12.1–16.2)
WBC: 15.4 10*3/uL — ABNORMAL HIGH (ref 3.6–10.2)

## 2023-11-18 LAB — SCAN DIFFERENTIAL
PLATELET MORPHOLOGY COMMENT: ADEQUATE
RBC MORPHOLOGY COMMENT: NORMAL

## 2023-11-19 LAB — ADULT ROUTINE BLOOD CULTURE, SET OF 2 BOTTLES (BACTERIA AND YEAST)
BLOOD CULTURE, ROUTINE: NO GROWTH
BLOOD CULTURE, ROUTINE: NO GROWTH

## 2024-07-14 ENCOUNTER — Ambulatory Visit: Payer: Self-pay | Attending: OTOLARYNGOLOGY | Admitting: OTOLARYNGOLOGY

## 2024-07-14 ENCOUNTER — Encounter (INDEPENDENT_AMBULATORY_CARE_PROVIDER_SITE_OTHER): Payer: Self-pay | Admitting: OTOLARYNGOLOGY

## 2024-07-14 ENCOUNTER — Other Ambulatory Visit: Payer: Self-pay

## 2024-07-14 VITALS — Ht 70.0 in | Wt 210.0 lb

## 2024-07-14 DIAGNOSIS — H6123 Impacted cerumen, bilateral: Secondary | ICD-10-CM | POA: Insufficient documentation

## 2024-07-14 DIAGNOSIS — R042 Hemoptysis: Secondary | ICD-10-CM | POA: Insufficient documentation

## 2024-07-14 DIAGNOSIS — H9312 Tinnitus, left ear: Secondary | ICD-10-CM | POA: Insufficient documentation

## 2024-07-14 DIAGNOSIS — J342 Deviated nasal septum: Secondary | ICD-10-CM | POA: Insufficient documentation

## 2024-07-14 DIAGNOSIS — J3489 Other specified disorders of nose and nasal sinuses: Secondary | ICD-10-CM | POA: Insufficient documentation

## 2024-07-14 DIAGNOSIS — H938X2 Other specified disorders of left ear: Secondary | ICD-10-CM

## 2024-07-14 DIAGNOSIS — H9190 Unspecified hearing loss, unspecified ear: Secondary | ICD-10-CM | POA: Insufficient documentation

## 2024-07-14 DIAGNOSIS — Z77122 Contact with and (suspected) exposure to noise: Secondary | ICD-10-CM

## 2024-07-14 MED ORDER — MUPIROCIN 2 % TOPICAL OINTMENT: TOPICAL_OINTMENT | Freq: Two times a day (BID) | CUTANEOUS | 1 refills | Status: DC

## 2024-07-14 NOTE — Addendum Note (Signed)
 Addended by: Syona Wroblewski C on: 07/14/2024 09:19 AM     Modules accepted: Orders

## 2024-07-14 NOTE — Procedures (Signed)
 [facility]  [address]  [place] [address]  Operated by [facility]  Procedure Note    Name: Evan Bell MRN:  [id number]   Date: 07/14/2024 DOB:  04/21/1951 (73 y.o.)         [phone] - REMOVAL IMPACTED CERUMEN W/ INSTRUMENT, UNILATERAL (AMB ONLY-PD)    Performed by: Margean Anes, DO  Authorized by: Margean Anes, DO    Time Out:     Immediately before the procedure, a time out was called:  Yes    Patient verified:  Yes    Procedure Verified:  Yes    Site Verified:  Yes  Documentation:      Procedure: Cerumen cleaning  Pre-op Dx: Cerumen impaction      Bilateral EAC(s) examined under binocular microscopy.  Cerumen and/or debris was cleaned from the canal(s) using curettes, suction, and alligator forceps.  Patient tolerated procedure well.  31575 - LARYNGOSCOPY, FLEXIBLE DIAGNOSTIC (AMB ONLY)    Performed by: Margean Anes, DO  Authorized by: Margean Anes, DO    Time Out:     Immediately before the procedure, a time out was called:  Yes    Patient verified:  Yes    Procedure Verified:  Yes    Site Verified:  Yes  Documentation:      [facility]  [address]  [place] [address]  Operated by [facility]  Procedure Note    Name: Evan Bell MRN:  [id number]  Date: 07/14/2024 DOB:  06-Nov-1950 (73 y.o.)        @PROCDOC @    Indications for procedure: Hemoptysis    Anesthesia: Oxymetazoline nasal spray    Description: The flexible endoscope was gently introduced into the nostril and passed along the floor of the nose to the nasopharynx. Adenoid was minimal and eustachian tubes normal. The retropalatal airway was patent.    The endoscope was passed to the oropharynx. Base of tongue displayed normal lingual tonsils, patent valelulla, and sharply defined upright epiglottis. Retrolingual airway was patent.    The larynx displayed normal true vocal cords with good mobility. False cords were normal. Arytenoid mucosa was pink with no edema.     The  piriform recesses were symmetric without secretion. The hypopharynx was symmetric without lesion.    Findings: no bleeding, no masses or lesion    The patient tolerated the procedure well.    Anes Margean, DO                 Sami Froh Hillman, DO

## 2024-07-14 NOTE — H&P (Signed)
 [facility]  [address]  [place] [address]  Operated by [facility]  Return Patient Visit    Name: Evan Bell MRN:  [id number]   Date: 07/14/2024 DOB: 1951/01/07 (73 y.o.)       Referring Provider:  Ezra Vicenta JONETTA PONCE, PA    Reason for Visit:   Chief Complaint   Patient presents with    Ear Problem(s)     Complains of tinnitus and fullness in left ear for the past several months. Also states having cracking and popping in left jaw.       History of Present Illness:  Evan Bell is a 74 y.o. male who is FU on sinuses and tongue lesion, last seen 04/2022.   Pt c/o loud ringing in the left ear, nearly constant x several months. Denies change in hearing, h/o ear infections. Endorses loud noise exposure in [employer].   Also c/o PND and seeing blood in his mouth in the morning x 2-3 months. States it is around a couple teaspoons of blood. He has not had recurrence of the tongue lesion. Using Flonase . Denies dysphagia,       Patient History:  Problem List[1]    Current Outpatient Medications   Medication Sig    aspirin  (ECOTRIN) 81 mg Oral Tablet, Delayed Release (E.C.) Take 1 Tablet (81 mg total) by mouth Daily    cholecalciferol, vitamin D3, 25 mcg (1,000 unit) Oral Tablet 75 mcg    cloNIDine (CATAPRES-TTS) 0.1 mg/24 hr Transdermal Patch Weekly APPLY 1 PATCH ONTO THE SKIN EVERY WEEK    cyanocobalamin (VITAMIN B-12) 500 mcg Oral Tablet 1 Tablet (500 mcg total)    famotidine  (PEPCID ) 40 mg Oral Tablet Take 1 Tablet (40 mg total) by mouth Daily    fluticasone  propionate (FLONASE ) 50 mcg/actuation Nasal Spray, Suspension Administer 2 Sprays into each nostril Twice daily    MetFORMIN  (GLUCOPHAGE ) 1,000 mg Oral Tablet Take 2 Tablets (2,000 mg total) by mouth Twice daily with food    Metoprolol  Succinate (TOPROL -XL) 200 mg Oral Tablet Sustained Release 24 hr Take 0.5 Tablets (100 mg total) by mouth Twice daily    Milk Thistle 175 mg Oral Tablet Take 1 Tablet (175 mg total) by mouth  Daily    mupirocin (BACTROBAN) 2 % Ointment Apply topically Twice daily    pantoprazole  (PROTONIX ) 40 mg Oral Tablet, Delayed Release (E.C.) Take 1 Tablet (40 mg total) by mouth Twice daily    Vardenafil (LEVITRA) 20 mg Oral Tablet 1 Tablet (20 mg total)      Allergies[2]  Past Medical History:   Diagnosis Date    Agent orange exposure     Diabetes mellitus, type 2     Esophageal reflux     Essential hypertension     Hypercholesterolemia       Past Surgical History:   Procedure Laterality Date    HX CHOLECYSTECTOMY        Family Medical History:       Problem Relation (Age of Onset)    Asthma Mother    Colon Cancer Father    Esophageal cancer Father    Kidney failure Father            Social History[3]    Review of Systems:  Review of Systems    Physical Exam:  Ht 1.778 m (5' 10)   Wt 95.3 kg (210 lb)   BMI 30.13 kg/m       ENT Physical Exam  Constitutional  Appearance: patient appears well-developed, well-nourished and well-groomed, patient is cooperative;  Communication/Voice: communication appropriate for developmental age; vocal quality normal;  Head and Face  Appearance: head appears normal, face appears normal and face appears atraumatic;  Salivary: glands normal;  Ear  Auricles: right auricle normal; left auricle normal;  Ear Canals: bilateral ear canals impacted cerumen observed;  Tympanic Membranes: right tympanic membrane normal; left tympanic membrane normal;  Nose  External Nose: nares patent bilaterally; external nose normal;  Internal Nose: bilateral intranasal mucosa edematous; nasal septal deviation present; deviation is to the right, septal deviation is severe; Septum comments: R ant septum with prominent blood vessels   bilateral inferior turbinates with hypertrophy;  Oral Cavity/Oropharynx  Lips: normal;  Tongue: normal;  Oral mucosa: normal;  Hard palate: normal;  Soft palate: normal;  OC/OP comments: No mass, lesion, ulcer of tongue  No bleeding sites seen   Neck  Neck: neck normal; neck  palpation normal;  Thyroid: thyroid normal;  Respiratory  Inspection: breathing unlabored; normal breathing rate;  Cardiovascular  Inspection: extremities are warm and well perfused;  Neurovestibular  Mental Status: alert and oriented;  Psychiatric: mood normal;  Cranial Nerves: cranial nerves intact;         Assessment:  ENCOUNTER DIAGNOSES     ICD-10-CM   1. Tinnitus of left ear  H93.12   2. Hearing loss, unspecified hearing loss type, unspecified laterality  H91.90   3. Nasal vestibulitis  J34.89   4. 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 Bilateral impacted cerumen  H61.23   6. Hemoptysis  R04.2       Plan:  Medical records reviewed on 07/14/2024.  AU cerumen debrided. Will get audiogram. Discussed masking techniques. Rx mupirocin.   No obvious causes of hemoptysis seen on exam. Will refer to pulmonary for evaluation of hemoptysis. Tongue lesion has resolved.    Orders Placed This Encounter    [phone] - REMOVAL IMPACTED CERUMEN W/ INSTRUMENT, UNILATERAL (AMB ONLY-PD)    31575 - LARYNGOSCOPY, FLEXIBLE DIAGNOSTIC (AMB ONLY)    AMB PRN REFERRAL EXTERNAL AUDIOLOGIST    POCT HEARING/VISION/TYMPANOGRAM (AMB ONLY)    mupirocin (BACTROBAN) 2 % Ointment      Return for Follow up after testing. SABRA Barnie SHAUNNA Irving, PA-C  The advanced practice clinician's documentation was reviewed/amended in its entirety with the assessment and plan portion completely performed independently by me during this separate encounter.          [1]   Patient Active Problem List  Diagnosis    Atypical angina (CMS HCC)    Primary hypertension    Mixed hyperlipidemia    Gastroenteritis    Hyponatremia    Hypomagnesemia    Nausea    Diarrhea    Generalized weakness    Fever   [2]   Allergies  Allergen Reactions    Buspirone  Other Adverse Reaction (Add comment)    Prazosin Mental Status Effect    Sertraline      Other reaction(s): Numbness   [3]   Social History  Tobacco Use    Smoking status: Never    Smokeless tobacco: Current   Substance Use Topics     Alcohol use: Yes     Alcohol/week: 14.0 standard drinks of alcohol     Types: 14 Cans of beer per week     Comment: States drinks couple of beers every day    Drug use: Never

## 2024-07-15 NOTE — Addendum Note (Signed)
 Addended by: Bobi Daudelin C on: 07/15/2024 10:47 AM     Modules accepted: Orders

## 2024-08-02 NOTE — H&P (Unsigned)
 PULMONARY AND SLEEP DISORDERS, [facility]  [address]  [place] [address]  Operated by [facility]     New Patient    Patient Name: Evan Bell  Date: 08/03/2024  Department:  PULMONARY AND SLEEP DISORDERS, [facility]  MRN: [id number]  DOB: 02-21-1951  Primary Care Provider:  Christopher Darin Parrish, DO  Referring Provider:  No ref. provider found      Chief Complaint:   Chief Complaint   Patient presents with    Sleep Apnea     PT presents to establish pulmonary care. He was referred  to the clinic by ENT due to hemoptysis.            History of Present Illness  Evan Bell is a 73 year old male who presents with blood in saliva in mornings and having ringing in the ears. He was referred by the ENT.    He experiences blood in his saliva every morning upon waking, which resolves after brushing his teeth and using a water  pick. This has been a consistent occurrence each morning. An ENT evaluation for ringing in the ears found no abnormalities in his mouth or throat.    A previous chest x-ray conducted at [facility] showed mild congestive heart failure with some fluid around the heart and haziness on the right side. He does not recall being sick at that time, which was in August. No regular fluid buildup, weak heart, or history of heart attack. He has undergone heart catheterization with no blockages found and reports having a strong heart according to his doctors.    No snoring, waking up frequently at night, or experiencing daytime sleepiness. He typically sleeps six hours, wakes to urinate, and then sleeps for another couple of hours, feeling rested in the morning. He uses Flonase  nasal spray for allergies, approximately three to four times a week, which he has been using for eight years. He experiences throat irritation from the spray, leading him to occasionally stop its use.    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 He served two tours in [place] and was exposed to [employer], which he believes contributed to his early-onset diabetes. His diabetes is well-controlled with an A1c between five and six.     Shortness of breath with exertion.      Exposure during [employer] to agent orange.      Results     Chest x-ray done at [facility] 04/14/24 Suspect mild CHF.  the cardiac silhouette and pulmonary vascularity are mildly prominent suggesting borderline CHF.  Minimal perihilar haziness is seen on the right.  Mild edema or infiltrates cannot be excluded.  The lung fields are otherwise clear.       Past Medical history  Past Medical History:   Diagnosis Date    Agent orange exposure     Diabetes mellitus, type 2     Esophageal reflux     Essential hypertension     Hypercholesterolemia      Past Surgical History  Past Surgical History:   Procedure Laterality Date    HX CHOLECYSTECTOMY           Medication List  Current Outpatient Medications   Medication Sig    aspirin  (ECOTRIN) 81 mg Oral Tablet, Delayed Release (E.C.) Take 1 Tablet (81 mg total) by mouth Daily    cholecalciferol, vitamin D3, 25 mcg (1,000 unit) Oral Tablet 75  mcg    cloNIDine (CATAPRES-TTS) 0.1 mg/24 hr Transdermal Patch Weekly APPLY 1 PATCH ONTO THE SKIN EVERY WEEK    cyanocobalamin (VITAMIN B-12) 500 mcg Oral Tablet 1 Tablet (500 mcg total)    famotidine  (PEPCID ) 40 mg Oral Tablet Take 1 Tablet (40 mg total) by mouth Daily    fluticasone  propionate (FLONASE ) 50 mcg/actuation Nasal Spray, Suspension Administer 2 Sprays into each nostril Twice daily    MetFORMIN  (GLUCOPHAGE ) 1,000 mg Oral Tablet Take 2 Tablets (2,000 mg total) by mouth Twice daily with food    Metoprolol  Succinate (TOPROL -XL) 200 mg Oral Tablet Sustained Release 24 hr Take 0.5 Tablets (100 mg total) by mouth Twice daily    Milk Thistle 175 mg Oral Tablet Take 1 Tablet (175 mg total) by mouth Daily    pantoprazole  (PROTONIX ) 40 mg Oral Tablet, Delayed  Release (E.C.) Take 1 Tablet (40 mg total) by mouth Twice daily     Allergy List  Allergy History as of 08/04/24       BUSPIRONE         Noted Status Severity Type Reaction    12/28/21 1136 Gladis Keen, LPN 88/86/81 Active    Other Adverse Reaction (Add comment)              PRAZOSIN         Noted Status Severity Type Reaction    12/28/21 1136 Gladis Keen, LPN 97/89/77 Active   Mental Status Effect              SERTRALINE         Noted Status Severity Type Reaction    12/28/21 1136 Gladis Keen, LPN 88/86/81 Active       Comments: Other reaction(s): Numbness                   Family History   Family Medical History:       Problem Relation (Age of Onset)    Asthma Mother    Colon Cancer Father    Esophageal cancer Father    Kidney failure Father            Social History  Social History     Socioeconomic History    Marital status: Married   Tobacco Use    Smoking status: Never    Smokeless tobacco: Current   Substance and Sexual Activity    Alcohol use: Yes     Alcohol/week: 14.0 standard drinks of alcohol     Types: 14 Cans of beer per week     Comment: States drinks couple of beers every day    Drug use: Never     Social Determinants of Health     Social Connections: Low Risk (11/14/2023)    Social Connections     SDOH Social Isolation: 5 or more times a week        Review of system  Patient reports that his top number of BP always high at doctors office.  PCP is aware and treating him for it.  But takes BP at home and normal levels.    General:  Denies fever, chills, night sweats, loss of appetite.  Neurological:  Denies dizziness or seizures.  Ears, Nose, Throat: Denies ear pain, nasal/sinus congestion or pain, or sore throat.   Gastrointestinal:  Denies uncontrolled reflux, heartburn, nausea, or diarrhea.  Cardiovascular:  Denies chest pain or irregular heartbeats.  Respiratory: see HPI  Musculoskeletal:  Denies uncontrolled joint pain or restless legs.  Endocrine/Metabolic:  Denies  weight gain or  weight loss.  Mental Status/Psychiatric:  Denies uncontrolled anxiety or depression.  Integumentary:  Denies rash or new abnormal skin lesions.    Vital Signs  Vitals:    08/03/24 0759   BP: (!) 221/85   Pulse: 71   Resp: 20   Temp: 36.3 C (97.4 F)   TempSrc: Thermal Scan   SpO2: 96%   Weight: 105 kg (231 lb)   Height: 1.778 m (5' 10)   BMI: 33.15              Physical Exam  GENERAL APPEARANCE OF THE PT: Alert, no acute distress. Normal appearance, well nourished.  EYES: Normal eye lids. Conjunctiva normal.  EARS NOSE MOUTH AND THROAT: External inspection of ears and nose with normal appearance. Nares patent. Throat normal.  NECK: Supple with trachea midline, non tender, no nodules, no masses, gland position midline.  RESPIRATORY: Lungs clear to auscultation bilaterally. No rales, no rhonchi, no wheezing. Respiratory effort with no tractions, breathing regular and unlabored.  CARDIOVASCULAR: Heart sounds normal. Regular rhythm and regular rate. No murmur, no peripheral edema.  MUSCULOSKELETAL: Normal gait and station, normal digits, no digital cyanosis or clubbing.  MENTAL STATUSPSYCHIATRIC: Alert- oriented to person, place, and time. Appropriate and normal mood.      ICD-10-CM    1. Hemoptysis  R04.2 CXR PA & Lat      2. Abnormal chest x-ray  R93.89       3. Primary hypertension  I10       4. Type 2 diabetes mellitus  E11.9       5. Gastroesophageal reflux disease, unspecified whether esophagitis present  K21.9       6. Obesity (BMI 30.0-34.9)  [id number]          Assessment & Plan  Hemoptysis  Chronic hemoptysis occurring every morning, primarily in the morning saliva. ENT evaluation showed no oral or throat abnormalities. Differential diagnosis includes potential lung involvement, possibly due to previous mild CHF or pneumonia. Long-term use of Flonase  nasal spray may contribute to nasal irritation and bleeding.  - Ordered chest x-ray to evaluate for lung involvement.  - Advised discontinuation of Flonase  nasal  spray for one week to assess for improvement in bleeding.  - Recommended use of saline nasal spray to moisturize nasal passages.    Abnormal chest imaging  Previous chest x-ray showed mild CHF and fluid around the heart. Differential includes chronic fluid accumulation or scarring. No current symptoms of shortness of breath or heart weakness. Regular cardiac evaluations show a strong heart with no blockages.  - Ordered chest x-ray to reassess lung condition.  - If chest x-ray shows persistent opacity, will consider ordering a chest CT for further evaluation.    Orders Placed This Encounter    CXR PA & Lat       Continue medications as prescribed/directed unless changed by provider.  Plan of care discussed with patient.    Return in about 2 months (around 10/04/2024).. Patient was advised to come back earlier if any symptoms get worse.  Also advised to come back for results of any test done.  If unable to keep regular appointment, patient was advised to schedule another appointment as soon as possible.     The patient was given the opportunity to ask questions and those questions were answered to the patient's satisfaction. The patient was encouraged to call with any additional questions or concerns. Discussed with the patient effects and side effects of medications.  Medication safety was discussed.  The patient was informed to contact the office within 7 business days if a message/lab results/referral/imaging results have not been conveyed to the patient.    Electronically signed by Ronal Kerns, APRN, CNP   Pulmonary and Critical care    This note was created with assistance from [employer] via capture of conversational audio. Consent was obtained from the patient and all parties present prior to recording.      This note may have been partially generated using MModal Fluency Direct system, and there may be some incorrect words, spellings, and punctuation that were not noted in checking the note before saving.

## 2024-08-03 ENCOUNTER — Other Ambulatory Visit: Payer: Self-pay

## 2024-08-03 ENCOUNTER — Encounter (HOSPITAL_COMMUNITY): Payer: Self-pay | Admitting: SLEEP MEDICINE

## 2024-08-03 ENCOUNTER — Ambulatory Visit: Payer: Self-pay | Attending: SLEEP MEDICINE | Admitting: SLEEP MEDICINE

## 2024-08-03 ENCOUNTER — Ambulatory Visit (HOSPITAL_BASED_OUTPATIENT_CLINIC_OR_DEPARTMENT_OTHER)
Admission: RE | Admit: 2024-08-03 | Discharge: 2024-08-03 | Disposition: A | Source: Ambulatory Visit | Attending: SLEEP MEDICINE

## 2024-08-03 VITALS — BP 221/85 | HR 71 | Temp 97.4°F | Resp 20 | Ht 70.0 in | Wt 231.0 lb

## 2024-08-03 DIAGNOSIS — I10 Essential (primary) hypertension: Secondary | ICD-10-CM

## 2024-08-03 DIAGNOSIS — R9389 Abnormal findings on diagnostic imaging of other specified body structures: Secondary | ICD-10-CM

## 2024-08-03 DIAGNOSIS — R042 Hemoptysis: Secondary | ICD-10-CM | POA: Insufficient documentation

## 2024-08-03 DIAGNOSIS — E66811 Obesity, class 1: Secondary | ICD-10-CM

## 2024-08-03 DIAGNOSIS — J986 Disorders of diaphragm: Secondary | ICD-10-CM

## 2024-08-03 DIAGNOSIS — K219 Gastro-esophageal reflux disease without esophagitis: Secondary | ICD-10-CM | POA: Insufficient documentation

## 2024-08-03 DIAGNOSIS — E119 Type 2 diabetes mellitus without complications: Secondary | ICD-10-CM | POA: Insufficient documentation

## 2024-08-03 DIAGNOSIS — Z6833 Body mass index (BMI) 33.0-33.9, adult: Secondary | ICD-10-CM

## 2024-08-04 ENCOUNTER — Ambulatory Visit (HOSPITAL_COMMUNITY): Payer: Self-pay | Admitting: SLEEP MEDICINE

## 2024-08-04 DIAGNOSIS — R9389 Abnormal findings on diagnostic imaging of other specified body structures: Secondary | ICD-10-CM | POA: Insufficient documentation

## 2024-08-04 DIAGNOSIS — E66811 Obesity, class 1: Secondary | ICD-10-CM | POA: Insufficient documentation

## 2024-08-04 DIAGNOSIS — R042 Hemoptysis: Secondary | ICD-10-CM | POA: Insufficient documentation

## 2024-08-04 DIAGNOSIS — E119 Type 2 diabetes mellitus without complications: Secondary | ICD-10-CM | POA: Insufficient documentation

## 2024-08-04 DIAGNOSIS — K219 Gastro-esophageal reflux disease without esophagitis: Secondary | ICD-10-CM | POA: Insufficient documentation

## 2024-08-25 ENCOUNTER — Ambulatory Visit (INDEPENDENT_AMBULATORY_CARE_PROVIDER_SITE_OTHER): Payer: Self-pay | Admitting: Nephrology

## 2024-08-25 ENCOUNTER — Encounter (INDEPENDENT_AMBULATORY_CARE_PROVIDER_SITE_OTHER): Payer: Self-pay | Admitting: Nephrology

## 2024-08-25 ENCOUNTER — Other Ambulatory Visit: Payer: Self-pay

## 2024-08-25 VITALS — BP 178/92 | HR 74 | Ht 70.0 in | Wt 224.0 lb

## 2024-08-25 DIAGNOSIS — E559 Vitamin D deficiency, unspecified: Secondary | ICD-10-CM

## 2024-08-25 DIAGNOSIS — Z7984 Long term (current) use of oral hypoglycemic drugs: Secondary | ICD-10-CM

## 2024-08-25 DIAGNOSIS — N189 Chronic kidney disease, unspecified: Secondary | ICD-10-CM

## 2024-08-25 DIAGNOSIS — E611 Iron deficiency: Secondary | ICD-10-CM

## 2024-08-25 DIAGNOSIS — E871 Hypo-osmolality and hyponatremia: Secondary | ICD-10-CM

## 2024-08-25 MED ORDER — METFORMIN 500 MG TABLET: 1000.0000 mg | ORAL_TABLET | Freq: Two times a day (BID) | ORAL | Status: AC

## 2024-08-25 NOTE — Progress Notes (Signed)
 NEPHROLOGY, [facility]  [address]  [place] [address]    Progress Note    Name: Evan Bell MRN:  [id number]   Date: 08/25/2024 DOB:  [birth year] (73 y.o.)              Nephrology Out  Patient Visit       Reason for the visit/consultation:  Hyponatremia  HPI : 73 y.o. gentleman with past medical history diabetes type 2 and coronary artery disease, dyslipidemia and PTSD, diagnosed recently with hyponatremia sodium ranging between 128-130 and a consult to nephrology was requested.    Patient is feeling well he drinks plenty of fluids.  Discussed with the him limiting fluids intake to 55 oz a day.    Patient is not taking diuretics, none of his medications cause hyponatremia.    Past medical history hypertension, hyponatremia, coronary artery disease, hypomagnesemia, PTSD, dyslipidemia.  Anemia.    Past surgical history heart catheterization   Social history patient does not smoke   Family history no kidney disease     ROS:     Systematic review of 12 organ systems was negative except what mentioned in in the HPI.     OBJECTIVE:   BP (!) 178/92   Pulse 74   Ht 1.778 m (5' 10)   Wt 102 kg (224 lb)   BMI 32.14 kg/m       General:  NAD, AAOx3  HEENT:  EOMI,  no icterus  NECK: No increased JVD.  Normal inspection   HEART: Normal S1 and S2. Regular rhythm. No murmurs or rubs. No chest wall tenderness   LUNGS: Clear to auscultation bilateral. No wheezes, rales, or rhonchi.   ABDOMEN: +BS, Soft, nontender and nondistended. No rebound or guarding present.   EXTREMITIES: No edema. No cyanosis or clubbing.No asterixis    NEURO : Normal speech, EOMI.       LABORATORY DATA:   Lab Results   Component Value Date    BUN 8 11/15/2023    BUN 11 11/14/2023    BUN 19 01/29/2022    CREATININE 0.79 11/15/2023    CREATININE 0.92 11/14/2023    CREATININE 1.46 (H) 01/29/2022    BUNCRRATIO 10 11/15/2023    BUNCRRATIO 12 11/14/2023    BUNCRRATIO 13 01/29/2022    GFR 93 11/15/2023    GFR 87 11/14/2023    GFR  51 (L) 01/29/2022    SODIUM 130 (L) 11/15/2023    SODIUM 128 (L) 11/14/2023    SODIUM 129 (L) 01/29/2022    POTASSIUM 3.6 11/15/2023    POTASSIUM 3.7 11/14/2023    POTASSIUM 4.2 01/29/2022    CHLORIDE 99 11/15/2023    CHLORIDE 93 (L) 11/14/2023    CHLORIDE 90 (L) 01/29/2022    CO2 25 11/15/2023    CO2 26 11/14/2023    CO2 28 01/29/2022    ANIONGAP 6 11/15/2023    ANIONGAP 9 11/14/2023    ANIONGAP 11 01/29/2022    CALCIUM 8.3 (L) 11/15/2023    CALCIUM 8.8 11/14/2023    CALCIUM 10.2 01/29/2022    ALBUMIN 3.3 (L) 11/15/2023    ALBUMIN 2.9 (L) 11/14/2023    HGB 11.1 (L) 11/15/2023    HGB 11.8 (L) 11/14/2023    HGB 14.2 01/29/2022    HCT 32.3 (L) 11/15/2023    HCT 34.3 (L) 11/14/2023    HCT 41.3 (L) 01/29/2022    IRON <10 (L) 11/15/2023    IRONBINDCAP 263 11/15/2023    IRONSAT  11/15/2023      Comment:      Calculation not performed.           MEDICATIONS:  Outpatient Medications Marked as Taking for the 08/25/24 encounter (Office Visit) with Helene Meter, MD   Medication Sig    amLODIPine (NORVASC) 5 mg Oral Tablet Take 1 Tablet (5 mg total) by mouth Twice daily    aspirin  (ECOTRIN) 81 mg Oral Tablet, Delayed Release (E.C.) Take 1 Tablet (81 mg total) by mouth Daily    buPROPion (WELLBUTRIN SR) 100 mg Oral tablet sustained-release 12 hr Take 1 Tablet (100 mg total) by mouth Twice daily    cetirizine (ZYRTEC) 10 mg Oral Tablet Take 1 Tablet (10 mg total) by mouth Daily    esomeprazole magnesium  (NEXIUM) 40 mg Oral Capsule, Delayed Release(E.C.) Take 1 Capsule (40 mg total) by mouth Daily    gabapentin (NEURONTIN) 400 mg Oral Capsule Take 1 Capsule (400 mg total) by mouth    hydrALAZINE (APRESOLINE) 50 mg Oral Tablet Take 1 Tablet (50 mg total) by mouth Three times a day    metFORMIN  (GLUCOPHAGE ) 500 mg Oral Tablet Take 2 Tablets (1,000 mg total) by mouth Twice daily with food    ondansetron  (ZOFRAN ) 4 mg Oral Tablet Take 1 Tablet (4 mg total) by mouth Every 8 hours as needed for Nausea/Vomiting    rosuvastatin (CRESTOR)  40 mg Oral Tablet Take 1 Tablet (40 mg total) by mouth Every evening    traZODone (DESYREL) 50 mg Oral Tablet Take 1 Tablet (50 mg total) by mouth Every night     Current Outpatient Medications   Medication Instructions    amLODIPine (NORVASC) 5 mg, 2 TIMES DAILY    aspirin  (ECOTRIN) 81 mg, Daily    buPROPion (WELLBUTRIN SR) 100 mg, 2 TIMES DAILY    cetirizine (ZYRTEC) 10 mg, Daily    cholecalciferol (vitamin D3) 75 mcg    cloNIDine (CATAPRES-TTS) 0.1 mg/24 hr Transdermal Patch Weekly APPLY 1 PATCH ONTO THE SKIN EVERY WEEK    cyanocobalamin (VITAMIN B-12) 500 mcg    esomeprazole magnesium  (NEXIUM) 40 mg, Daily    famotidine  (PEPCID ) 40 mg Oral Tablet 1 Tablet, Daily    fluticasone  propionate (FLONASE ) 50 mcg/actuation Nasal Spray, Suspension 2 Sprays, 2 TIMES DAILY    gabapentin (NEURONTIN) 400 mg    hydrALAZINE (APRESOLINE) 50 mg, 3 TIMES DAILY    metFORMIN  (GLUCOPHAGE ) 1,000 mg, Oral, 2 TIMES DAILY WITH FOOD    Metoprolol  Succinate (TOPROL -XL) 200 mg Oral Tablet Sustained Release 24 hr 0.5 Tablets, 2 TIMES DAILY    ondansetron  (ZOFRAN ) 4 mg, EVERY 8 HOURS PRN    rosuvastatin (CRESTOR) 40 mg, EVERY EVENING    traZODone (DESYREL) 50 mg, NIGHTLY       ASSESSMENT / PLAN:   ENCOUNTER DIAGNOSES     ICD-10-CM   1. CKD (chronic kidney disease)  N18.9   2. Vitamin D deficiency  E55.9              Chronic kidney disease  -Stage IIIb  -Due to diabetes and hypertension  -Creatinine is stable   Lab Results   Component Value Date    CREATININE 0.79 11/15/2023    CREATININE 0.92 11/14/2023        Hyponatremia  Unclear etiology check urine sodium, TSH, urine osmolarity, no signs of congestive heart failure fluid overload.  Possibly SIADH recent chest x-ray does not indicate lung mass   Patient was educated about balancing fluids  Patient is asymptomatic  Hypertension  -Blood pressure is acceptable  -Goal less than 130/80  -Continue lisinopril  -Low-salt diet    Diabetes type 2  -A1c goal is less than 7   Lab Results    Component Value Date    HA1C 5.7 11/15/2023      -Continue current medications  -Diabetic diet  -Increase activity and exercise as possible  -Monitor A1C    Gout  -Check uric acid No results found for: URICACID    -Low uric acid diet  -continue current management    Anemia of CKD  - Y N   - HB   Lab Results   Component Value Date    HGB 11.1 (L) 11/15/2023       - may need epo if HB less than 9.0  -will monitor Iron panel.     Chronic kidney disease stage 2  Baseline creatinine  is 0.79.  We will monitor    Hypomagnesemia replace and monitor    Iron-deficiency noted  Follow up with primary team, and may benefit from GI consultation.    Orders Placed This Encounter    BASIC METABOLIC PANEL    CBC/DIFF    MAGNESIUM     MICROALBUMIN/CREATININE RATIO, URINE, RANDOM    PARATHYROID HORMONE (PTH)    PROTEIN/CREATININE RATIO, URINE, RANDOM    URIC ACID    VITAMIN D 25 TOTAL    metFORMIN  (GLUCOPHAGE ) 500 mg Oral Tablet

## 2024-09-24 ENCOUNTER — Ambulatory Visit (INDEPENDENT_AMBULATORY_CARE_PROVIDER_SITE_OTHER): Payer: Self-pay | Admitting: OTOLARYNGOLOGY

## 2024-09-30 NOTE — Progress Notes (Unsigned)
 PULMONARY AND SLEEP DISORDERS, [facility]  [address]  [place] [address]  Operated by [facility]     Follow up Sleep Note    Patient Name: Evan Bell  Date: 10/01/2024  Department:  PULMONARY AND SLEEP DISORDERS, [facility]  MRN: [id number]  DOB: 1950/10/29  Primary Care Provider:  Christopher Darin Parrish, DO  Referring Provider:  No ref. provider found      Chief Complaint: No chief complaint on file.        History of Present Illness     Patient established pulmonary care in December.  Referred by ENT.  Patient now referred for sleep care here at this office as well.      Epworth assessment done in office today with a total score of ***    Shortness of breath with exertion.       Exposure during [employer] to agent orange.      Results    Chest x-ray done at [facility] 04/14/24 Suspect mild CHF.  the cardiac silhouette and pulmonary vascularity are mildly prominent suggesting borderline CHF.  Minimal perihilar haziness is seen on the right.  Mild edema or infiltrates cannot be excluded.  The lung fields are otherwise clear.     Chest x-ray done 08/03/24 showing no acute findings.  No focal alveolar infiltrate is identified. Mild elevation the right hemidiaphragm is noted. The heart size is at the upper limits of normal. No pleural effusion or pneumothorax is seen. Degenerative changes of the thoracic spine are seen.         Past Medical History:  Past Medical History:   Diagnosis Date    Agent orange exposure     Diabetes mellitus, type 2     Esophageal reflux     Essential hypertension     Hypercholesterolemia      Past Surgical History  Past Surgical History:   Procedure Laterality Date    HX CHOLECYSTECTOMY       Medication List  Current Outpatient Medications   Medication Sig    amLODIPine (NORVASC) 5 mg Oral Tablet Take 1 Tablet (5 mg total) by mouth Twice daily    aspirin  (ECOTRIN) 81 mg Oral Tablet, Delayed Release (E.C.) Take 1 Tablet  (81 mg total) by mouth Daily    buPROPion (WELLBUTRIN SR) 100 mg Oral tablet sustained-release 12 hr Take 1 Tablet (100 mg total) by mouth Twice daily    cetirizine (ZYRTEC) 10 mg Oral Tablet Take 1 Tablet (10 mg total) by mouth Daily    cholecalciferol, vitamin D3, 25 mcg (1,000 unit) Oral Tablet 75 mcg    cloNIDine (CATAPRES-TTS) 0.1 mg/24 hr Transdermal Patch Weekly APPLY 1 PATCH ONTO THE SKIN EVERY WEEK    cyanocobalamin (VITAMIN B-12) 500 mcg Oral Tablet 1 Tablet (500 mcg total)    esomeprazole magnesium  (NEXIUM) 40 mg Oral Capsule, Delayed Release(E.C.) Take 1 Capsule (40 mg total) by mouth Daily    famotidine  (PEPCID ) 40 mg Oral Tablet Take 1 Tablet (40 mg total) by mouth Daily    fluticasone  propionate (FLONASE ) 50 mcg/actuation Nasal Spray, Suspension Administer 2 Sprays into each nostril Twice daily    gabapentin (NEURONTIN) 400 mg Oral Capsule Take 1 Capsule (400 mg total) by mouth    hydrALAZINE (APRESOLINE) 50 mg Oral Tablet Take 1 Tablet (50 mg total) by mouth Three times a day    metFORMIN  (GLUCOPHAGE ) 500 mg Oral Tablet Take 2 Tablets (1,000 mg total) by mouth Twice daily  with food    Metoprolol  Succinate (TOPROL -XL) 200 mg Oral Tablet Sustained Release 24 hr Take 0.5 Tablets (100 mg total) by mouth Twice daily    ondansetron  (ZOFRAN ) 4 mg Oral Tablet Take 1 Tablet (4 mg total) by mouth Every 8 hours as needed for Nausea/Vomiting    rosuvastatin (CRESTOR) 40 mg Oral Tablet Take 1 Tablet (40 mg total) by mouth Every evening    traZODone (DESYREL) 50 mg Oral Tablet Take 1 Tablet (50 mg total) by mouth Every night     Allergy List  Allergy History as of 09/30/24       BUSPIRONE         Noted Status Severity Type Reaction    08/25/24 1355 [place], [place], [place] 07/09/17 Deleted    Other Adverse Reaction (Add comment)    12/28/21 1136 Gladis Keen, LPN 88/86/81 Active    Other Adverse Reaction (Add comment)              PRAZOSIN         Noted Status Severity Type Reaction    12/28/21 1136 Gladis Keen, LPN 97/89/77 Active   Mental Status Effect              SERTRALINE         Noted Status Severity Type Reaction    12/28/21 1136 Gladis Keen, LPN 88/86/81 Active       Comments: Other reaction(s): Numbness                   Family History   Family Medical History:       Problem Relation (Age of Onset)    Asthma Mother    Colon Cancer Father    Esophageal cancer Father    Kidney failure Father            Social History  Social History     Socioeconomic History    Marital status: Married   Tobacco Use    Smoking status: Never    Smokeless tobacco: Current   Vaping Use    Vaping status: Never Used   Substance and Sexual Activity    Alcohol use: Yes     Alcohol/week: 14.0 standard drinks of alcohol     Types: 14 Cans of beer per week     Comment: States drinks couple of beers every day    Drug use: Never     Social Determinants of Health     Social Connections: Low Risk (11/14/2023)    Social Connections     SDOH Social Isolation: 5 or more times a week        Review of system  General:  Denies fever or chills.  Neurological:  Denies seizures.  Ears, Nose, Throat: Denies ear pain or sore throat.   Gastrointestinal:  Denies uncontrolled reflux, nausea, or diarrhea.  Cardiovascular:  Denies chest pain.  Respiratory: see HPI  Musculoskeletal:  Denies uncontrolled joint pain.  Mental Status/Psychiatric:  Denies uncontrolled anxiety or depression.  Integumentary:  Denies any new or untreated rash or abnormal skin lesions.    Vital Signs  There were no vitals filed for this visit.       Physical Exam         No diagnosis found.   Assessment & Plan      No orders of the defined types were placed in this encounter.      Continue medications as prescribed/directed unless changed by provider.  Plan of care discussed with  patient.    No follow-ups on file.   and/or as needed.  Patient was advised to come back earlier if any symptoms get worse.  Also advised to come back for results of any test done.  If unable to keep  regular appointment, patient was advised to schedule another appointment as soon as possible.     The patient was given the opportunity to ask questions and those questions were answered to the patient's satisfaction. The patient was encouraged to call with any additional questions or concerns. Discussed with the patient effects and side effects of medications. Medication safety was discussed.  The patient was informed to contact the office within 7 business days if a message/lab results/referral/imaging results have not been conveyed to the patient.    Electronically signed by Ronal Kerns, APRN, CNP   Pulmonary and Critical care    This note was created with assistance from [employer] via capture of conversational audio. Consent was obtained from the patient and all parties present prior to recording.      This note may have been partially generated using MModal Fluency Direct system, and there may be some incorrect words, spellings, and punctuation that were not noted in checking the note before saving.

## 2024-10-01 ENCOUNTER — Ambulatory Visit: Payer: Self-pay | Admitting: SLEEP MEDICINE

## 2024-10-01 ENCOUNTER — Encounter (HOSPITAL_COMMUNITY): Payer: Self-pay | Admitting: SLEEP MEDICINE

## 2024-10-01 ENCOUNTER — Other Ambulatory Visit: Payer: Self-pay

## 2024-10-01 VITALS — BP 203/89 | HR 83 | Temp 98.1°F | Resp 16 | Ht 70.0 in | Wt 230.0 lb

## 2024-10-01 DIAGNOSIS — K219 Gastro-esophageal reflux disease without esophagitis: Secondary | ICD-10-CM

## 2024-10-01 DIAGNOSIS — G4733 Obstructive sleep apnea (adult) (pediatric): Secondary | ICD-10-CM

## 2024-10-01 DIAGNOSIS — E119 Type 2 diabetes mellitus without complications: Secondary | ICD-10-CM

## 2024-10-01 DIAGNOSIS — I10 Essential (primary) hypertension: Secondary | ICD-10-CM

## 2024-10-13 ENCOUNTER — Ambulatory Visit (HOSPITAL_COMMUNITY): Payer: Self-pay

## 2024-11-10 ENCOUNTER — Ambulatory Visit (INDEPENDENT_AMBULATORY_CARE_PROVIDER_SITE_OTHER): Admitting: OTOLARYNGOLOGY

## 2024-12-10 ENCOUNTER — Other Ambulatory Visit (INDEPENDENT_AMBULATORY_CARE_PROVIDER_SITE_OTHER): Payer: Self-pay

## 2024-12-17 ENCOUNTER — Encounter (INDEPENDENT_AMBULATORY_CARE_PROVIDER_SITE_OTHER): Payer: Self-pay | Admitting: Nephrology
